# Patient Record
Sex: Male | Born: 1960
Health system: Southern US, Community
[De-identification: ages and names within clinical notes are randomized; demographics above are authoritative.]

## PROBLEM LIST (undated history)

## (undated) DIAGNOSIS — E785 Hyperlipidemia, unspecified: Secondary | ICD-10-CM

## (undated) DIAGNOSIS — H9190 Unspecified hearing loss, unspecified ear: Secondary | ICD-10-CM

## (undated) DIAGNOSIS — L659 Nonscarring hair loss, unspecified: Secondary | ICD-10-CM

## (undated) DIAGNOSIS — N400 Enlarged prostate without lower urinary tract symptoms: Secondary | ICD-10-CM

## (undated) DIAGNOSIS — H8109 Meniere's disease, unspecified ear: Secondary | ICD-10-CM

## (undated) DIAGNOSIS — F419 Anxiety disorder, unspecified: Secondary | ICD-10-CM

## (undated) HISTORY — DX: Nonscarring hair loss, unspecified: L65.9

## (undated) HISTORY — DX: Unspecified hearing loss, unspecified ear: H91.90

## (undated) HISTORY — PX: HIP SURGERY: SHX245

## (undated) HISTORY — DX: Anxiety disorder, unspecified: F41.9

## (undated) HISTORY — DX: Hyperlipidemia, unspecified: E78.5

## (undated) HISTORY — PX: KNEE ARTHROSCOPY: SUR90

## (undated) HISTORY — DX: Meniere's disease, unspecified ear: H81.09

## (undated) HISTORY — DX: Benign prostatic hyperplasia without lower urinary tract symptoms: N40.0

## (undated) HISTORY — PX: EYE SURGERY: SHX253

---

## 1995-08-25 HISTORY — PX: KNEE ARTHROSCOPY: SUR90

## 1997-08-24 HISTORY — PX: HIP SURGERY: SHX245

## 1997-11-17 ENCOUNTER — Other Ambulatory Visit: Admission: RE | Admit: 1997-11-17 | Discharge: 1997-11-17 | Payer: Self-pay | Admitting: Specialist

## 1997-12-24 ENCOUNTER — Ambulatory Visit (HOSPITAL_COMMUNITY): Admission: RE | Admit: 1997-12-24 | Discharge: 1997-12-24 | Payer: Self-pay | Admitting: Neurology

## 1998-01-07 ENCOUNTER — Encounter: Admission: RE | Admit: 1998-01-07 | Discharge: 1998-01-07 | Payer: Self-pay | Admitting: Internal Medicine

## 1998-06-26 ENCOUNTER — Encounter: Payer: Self-pay | Admitting: Internal Medicine

## 1998-06-26 ENCOUNTER — Ambulatory Visit (HOSPITAL_COMMUNITY): Admission: RE | Admit: 1998-06-26 | Discharge: 1998-06-26 | Payer: Self-pay | Admitting: Internal Medicine

## 1998-06-28 ENCOUNTER — Ambulatory Visit (HOSPITAL_COMMUNITY): Admission: RE | Admit: 1998-06-28 | Discharge: 1998-06-28 | Payer: Self-pay | Admitting: Internal Medicine

## 1998-06-28 ENCOUNTER — Encounter: Payer: Self-pay | Admitting: Internal Medicine

## 1998-07-10 ENCOUNTER — Other Ambulatory Visit: Admission: RE | Admit: 1998-07-10 | Discharge: 1998-07-10 | Payer: Self-pay | Admitting: Gastroenterology

## 1998-10-11 ENCOUNTER — Other Ambulatory Visit: Admission: RE | Admit: 1998-10-11 | Discharge: 1998-10-11 | Payer: Self-pay | Admitting: Specialist

## 1998-11-13 ENCOUNTER — Ambulatory Visit (HOSPITAL_COMMUNITY): Admission: RE | Admit: 1998-11-13 | Discharge: 1998-11-13 | Payer: Self-pay | Admitting: Orthopedic Surgery

## 1999-09-22 ENCOUNTER — Encounter: Admission: RE | Admit: 1999-09-22 | Discharge: 1999-09-22 | Payer: Self-pay | Admitting: Internal Medicine

## 1999-12-19 ENCOUNTER — Encounter: Payer: Self-pay | Admitting: Orthopedic Surgery

## 1999-12-24 ENCOUNTER — Ambulatory Visit (HOSPITAL_COMMUNITY): Admission: RE | Admit: 1999-12-24 | Discharge: 1999-12-24 | Payer: Self-pay | Admitting: Orthopedic Surgery

## 2006-09-07 ENCOUNTER — Ambulatory Visit: Payer: Self-pay | Admitting: Internal Medicine

## 2006-09-07 LAB — CONVERTED CEMR LAB
ALT: 20 units/L (ref 0–40)
AST: 28 units/L (ref 0–37)
Albumin: 4.1 g/dL (ref 3.5–5.2)
Alkaline Phosphatase: 36 units/L — ABNORMAL LOW (ref 39–117)
BUN: 16 mg/dL (ref 6–23)
Basophils Absolute: 0 10*3/uL (ref 0.0–0.1)
Basophils Relative: 0.4 % (ref 0.0–1.0)
CO2: 30 meq/L (ref 19–32)
Calcium: 9.9 mg/dL (ref 8.4–10.5)
Chloride: 101 meq/L (ref 96–112)
Cholesterol: 169 mg/dL (ref 0–200)
Creatinine, Ser: 0.9 mg/dL (ref 0.4–1.5)
Eosinophils Relative: 4.6 % (ref 0.0–5.0)
GFR calc Af Amer: 116 mL/min
GFR calc non Af Amer: 96 mL/min
Glucose, Bld: 94 mg/dL (ref 70–99)
HCT: 41.4 % (ref 39.0–52.0)
HDL: 54.3 mg/dL (ref >39.0)
Hemoglobin: 14.2 g/dL (ref 13.0–17.0)
LDL Cholesterol: 100 mg/dL — ABNORMAL HIGH (ref 0–99)
Lymphocytes Relative: 36.3 % (ref 12.0–46.0)
MCHC: 34.4 g/dL (ref 30.0–36.0)
MCV: 92.9 fL (ref 78.0–100.0)
Monocytes Absolute: 0.4 10*3/uL (ref 0.2–0.7)
Monocytes Relative: 8.1 % (ref 3.0–11.0)
Neutro Abs: 2.2 10*3/uL (ref 1.4–7.7)
Neutrophils Relative %: 50.6 % (ref 43.0–77.0)
Platelets: 196 10*3/uL (ref 150–400)
Potassium: 4.4 meq/L (ref 3.5–5.1)
RBC: 4.46 M/uL (ref 4.22–5.81)
RDW: 12.3 % (ref 11.5–14.6)
Sodium: 137 meq/L (ref 135–145)
TSH: 1.59 microintl units/mL (ref 0.35–5.50)
Total Bilirubin: 1.2 mg/dL (ref 0.3–1.2)
Total CHOL/HDL Ratio: 3.1
Total Protein: 7.5 g/dL (ref 6.0–8.3)
Triglycerides: 73 mg/dL (ref 0–149)
VLDL: 15 mg/dL (ref 0–40)
WBC: 4.4 10*3/uL — ABNORMAL LOW (ref 4.5–10.5)

## 2006-09-13 ENCOUNTER — Ambulatory Visit: Payer: Self-pay | Admitting: Internal Medicine

## 2007-09-01 ENCOUNTER — Telehealth: Payer: Self-pay | Admitting: Internal Medicine

## 2007-09-19 ENCOUNTER — Encounter: Payer: Self-pay | Admitting: Internal Medicine

## 2008-09-14 ENCOUNTER — Ambulatory Visit: Payer: Self-pay | Admitting: Internal Medicine

## 2008-10-02 LAB — CONVERTED CEMR LAB
ALT: 16 units/L (ref 0–53)
AST: 22 units/L (ref 0–37)
Albumin: 4.8 g/dL (ref 3.5–5.2)
Alkaline Phosphatase: 36 units/L — ABNORMAL LOW (ref 39–117)
BUN: 18 mg/dL (ref 6–23)
Basophils Absolute: 0 10*3/uL (ref 0.0–0.1)
Basophils Relative: 1 % (ref 0–1)
Bilirubin, Direct: 0.1 mg/dL (ref 0.0–0.3)
CO2: 25 meq/L (ref 19–32)
Calcium: 10 mg/dL (ref 8.4–10.5)
Chloride: 101 meq/L (ref 96–112)
Cholesterol: 172 mg/dL (ref 0–200)
Creatinine, Ser: 1.14 mg/dL (ref 0.40–1.50)
Eosinophils Absolute: 0.2 10*3/uL (ref 0.0–0.7)
Eosinophils Relative: 3 % (ref 0–5)
Glucose, Bld: 94 mg/dL (ref 70–99)
HCT: 42 % (ref 39.0–52.0)
HDL: 68 mg/dL (ref >39)
Hemoglobin: 14.6 g/dL (ref 13.0–17.0)
Hep B S Ab: POSITIVE — AB
Indirect Bilirubin: 0.6 mg/dL (ref 0.0–0.9)
LDL Cholesterol: 93 mg/dL (ref 0–99)
Lymphocytes Relative: 40 % (ref 12–46)
Lymphs Abs: 2.1 10*3/uL (ref 0.7–4.0)
MCHC: 34.8 g/dL (ref 30.0–36.0)
MCV: 89 fL (ref 78.0–100.0)
Monocytes Absolute: 0.3 10*3/uL (ref 0.1–1.0)
Monocytes Relative: 6 % (ref 3–12)
Neutro Abs: 2.7 10*3/uL (ref 1.7–7.7)
Neutrophils Relative %: 51 % (ref 43–77)
PSA: 0.41 ng/mL (ref 0.10–4.00)
Platelets: 218 10*3/uL (ref 150–400)
Potassium: 4.8 meq/L (ref 3.5–5.3)
RBC: 4.72 M/uL (ref 4.22–5.81)
RDW: 12.4 % (ref 11.5–15.5)
Rubella: 10.6 intl units/mL — ABNORMAL HIGH
Rubeola IgG: 0.94 — ABNORMAL HIGH
Sodium: 140 meq/L (ref 135–145)
TSH: 2.08 microintl units/mL (ref 0.350–4.50)
Testosterone: 481.15 ng/dL (ref 350–890)
Total Bilirubin: 0.7 mg/dL (ref 0.3–1.2)
Total CHOL/HDL Ratio: 2.5
Total Protein: 7.9 g/dL (ref 6.0–8.3)
Triglycerides: 56 mg/dL (ref <150)
VLDL: 11 mg/dL (ref 0–40)
WBC: 5.4 10*3/uL (ref 4.0–10.5)

## 2009-03-15 ENCOUNTER — Telehealth: Payer: Self-pay | Admitting: Gastroenterology

## 2011-01-09 NOTE — Op Note (Signed)
[place]  Patient:    [name], [name]                        MRN: [id number] Proc. Date: 12/24/99 Adm. Date:  [id number] Disc. 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  PROCEDURE:  Left hip arthroscopy with labral and chondral debridement.  SURGEON:  Trudee Grip, M.D.  ASSISTANT:  Grayland Jack, P.A.  ANESTHESIA:  General.  ESTIMATED BLOOD LOSS:  Minimal.  DRAINS:  None.  COMPLICATIONS:  None.  CONDITION:  Stable; to recovery.  BRIEF CLINICAL NOTE:  [name] is a 50 year old male who had severe pain and mechanical symptoms in both hips.  He underwent right hip arthroscopy, labral debridement nd chondroplasty two years ago with excellent result.  Symptoms have become progressive in his left hip.  He has documented evidence of labral tear and some degenerative changes on radiographic studies.  He presents now for arthroscopic  debridement.  PROCEDURE IN DETAIL:  After the successful administration of general anesthetic, the patient was placed in the right lateral decubitus position with left side up and a padded perineal post was placed between his legs.  His genitals are protected from the post.  The left foot is then placed into the traction boot and secured; it is well-padded before being placed in the boot.  Under fluoroscopic guidance, traction is applied to the hip to allow it to be distracted approximately 8 mm. At this point, the hip is prepped and draped in the usual sterile fashion.  The two long spinal needles are then passed, one anterior peritrochanteric and one posterior peritrochanteric, and shown to enter the joint under fluoroscopic guidance.  Fluid is injected through the posterior needle and it does flow outwards through the anterior,  confirming intra-articular placement.  The ______ wires are then passed through the needles and then the dilators, 5 then 7 mm, passed over the wires.  A 7-mm cannula is placed posteriorly and then the camera attached to it. Arthroscopic visualization of the joint is then initiated.  The placement for the anterior portal was felt to be ideal; thus, it was not changed.  Visualization n the joint showed that the entire posterior half of the acetabulum and femoral head as well as the posterior labrum appeared totally normal.  The fovea also appears normal with a normal ligamentum teres.  The femoral head appears normal throughout the entire visualization field; anteriorly, however, there is an area of chondral fissuring, just adjacent to an anterior labral tear.  At this point, the dilator is passed over the wire for the anterior portal and a 7-mm cannula is placed.  A probe is placed through this and the labral tear is interstitial and frayed.  The area of chondral fissuring is probed and is a very unstable chondral flap, which is delaminating from the underlying bone; this is debrided back to a bony base using a ring curette, 4.2 shaver and ArthroCare ablation wand.  The size of the defect s about 1 x 2 cm when fully debrided.  The labral tear is also debrided back to a  stable base and then probed and found to be stable.  Once  fully debrided, the edges of the cartilage on the acetabulum are probed and feel stable.  There is no further delamination.  The portal was then switched and the camera passed posterior and  working portal anterior.  A Steinmann pin, 7/16ths, is then passed through the anterior portal and then multiple perforations are made in the exposed anterior  acetabular bone to hopefully allow for fibrocartilage formation.  At this point, the equipment is removed and 15 cc of 0.5% Marcaine with epinephrine was injected through the inflow cannula.  The incisions  are then closed, a bulky sterile dressing applied and the patient awakened and transported to recovery in stable  condition.DD:  12/24/99 TD:  12/26/99 Job: [id number]

## 2013-05-18 ENCOUNTER — Encounter: Payer: Self-pay | Admitting: Gastroenterology

## 2013-05-24 ENCOUNTER — Encounter: Payer: Self-pay | Admitting: Gastroenterology

## 2013-07-26 ENCOUNTER — Ambulatory Visit (AMBULATORY_SURGERY_CENTER): Payer: Self-pay | Admitting: *Deleted

## 2013-07-26 VITALS — Ht 68.0 in | Wt 161.0 lb

## 2013-07-26 DIAGNOSIS — Z1211 Encounter for screening for malignant neoplasm of colon: Secondary | ICD-10-CM

## 2013-07-26 MED ORDER — MOVIPREP 100 G PO SOLR: ORAL | Status: DC

## 2013-07-26 NOTE — Progress Notes (Signed)
Patient denies any allergies to eggs or soy. 

## 2013-08-04 ENCOUNTER — Encounter: Payer: Self-pay | Admitting: Gastroenterology

## 2013-08-10 ENCOUNTER — Ambulatory Visit (AMBULATORY_SURGERY_CENTER): Payer: PRIVATE HEALTH INSURANCE | Admitting: Gastroenterology

## 2013-08-10 ENCOUNTER — Encounter: Payer: Self-pay | Admitting: Gastroenterology

## 2013-08-10 VITALS — BP 99/53 | HR 47 | Temp 98.1°F | Resp 18 | Ht 68.0 in | Wt 161.0 lb

## 2013-08-10 DIAGNOSIS — Z1211 Encounter for screening for malignant neoplasm of colon: Secondary | ICD-10-CM

## 2013-08-10 MED ORDER — SODIUM CHLORIDE 0.9 % IV SOLN: 500.0000 mL | INTRAVENOUS | Status: DC

## 2013-08-10 NOTE — Progress Notes (Signed)
No egg or soy allergy. ewm No problems with past sedation. ewm 

## 2013-08-10 NOTE — Progress Notes (Signed)
Report to pacu rn, vss, bbs=clear 

## 2013-08-10 NOTE — Op Note (Signed)
[facility] [address] [place], [address]   COLONOSCOPY PROCEDURE REPORT  PATIENT: Benjamin, Ellis  MR#: [id number] BIRTHDATE: 01/14/1961 , 52  yrs. old GENDER: Male ENDOSCOPIST: Meryl Dare, MD, [facility] REFERRED XB:JYNWGN Eloise Harman, M.D. PROCEDURE DATE:  08/10/2013 PROCEDURE:   Colonoscopy, screening First Screening Colonoscopy - Avg.  risk and is 50 yrs.  old or older Yes.  Prior Negative Screening - Now for repeat screening. N/A  History of Adenoma - Now for follow-up colonoscopy & has been > or = to 3 yrs.  N/A  Polyps Removed Today? No.  Recommend repeat exam, <10 yrs? No. ASA CLASS:   Class I INDICATIONS:average risk screening. MEDICATIONS: MAC sedation, administered by CRNA and propofol (Diprivan) 400mg  IV DESCRIPTION OF PROCEDURE:   After the risks benefits and alternatives of the procedure were thoroughly explained, informed consent was obtained.  A digital rectal exam revealed no abnormalities of the rectum.   The LB [id number]  endoscope was introduced through the anus and advanced to the cecum, which was identified by both the appendix and ileocecal valve. No adverse events experienced.   The quality of the prep was excellent, using MoviPrep  The instrument was then slowly withdrawn as the colon was fully examined.  COLON FINDINGS: A normal appearing cecum, ileocecal valve, and appendiceal orifice were identified.  The ascending, hepatic flexure, transverse, splenic flexure, descending, sigmoid colon and rectum appeared unremarkable.  No polyps or cancers were seen. Retroflexed views revealed no abnormalities. The time to cecum=6 minutes 43 seconds.  Withdrawal time=13 minutes 35 seconds.  The scope was withdrawn and the procedure completed.  COMPLICATIONS: There were no complications.  ENDOSCOPIC IMPRESSION: 1.  Normal colon  RECOMMENDATIONS: 1.  You should continue to follow colorectal cancer screening guidelines for "routine  risk" patients with a repeat colonoscopy in 10 years.  There is no need for routine screening FOBT (stool) testing for at least 5 years.  eSigned:  Meryl Dare, MD, [facility] 08/10/2013 8:39 AM

## 2013-08-10 NOTE — Patient Instructions (Signed)
YOU HAD AN ENDOSCOPIC PROCEDURE TODAY AT THE [facility]: Refer to the procedure report that was given to you for any specific questions about what was found during the examination.  If the procedure report does not answer your questions, please call your gastroenterologist to clarify.  If you requested that your care partner not be given the details of your procedure findings, then the procedure report has been included in a sealed envelope for you to review at your convenience later.  YOU SHOULD EXPECT: Some feelings of bloating in the abdomen. Passage of more gas than usual.  Walking can help get rid of the air that was put into your GI tract during the procedure and reduce the bloating. If you had a lower endoscopy (such as a colonoscopy or flexible sigmoidoscopy) you may notice spotting of blood in your stool or on the toilet paper. If you underwent a bowel prep for your procedure, then you may not have a normal bowel movement for a few days.  DIET: Your first meal following the procedure should be a light meal and then it is ok to progress to your normal diet.  A half-sandwich or bowl of soup is an example of a good first meal.  Heavy or fried foods are harder to digest and may make you feel nauseous or bloated.  Likewise meals heavy in dairy and vegetables can cause extra gas to form and this can also increase the bloating.  Drink plenty of fluids but you should avoid alcoholic beverages for 24 hours.  ACTIVITY: Your care partner should take you home directly after the procedure.  You should plan to take it easy, moving slowly for the rest of the day.  You can resume normal activity the day after the procedure however you should NOT DRIVE or use heavy machinery for 24 hours (because of the sedation medicines used during the test).    SYMPTOMS TO REPORT IMMEDIATELY: A gastroenterologist can be reached at any hour.  During normal business hours, 8:30 AM to 5:00 PM Monday through Friday,  call [phone].  After hours and on weekends, please call the GI answering service at [phone] who will take a message and have the physician on call contact you.   Following lower endoscopy (colonoscopy or flexible sigmoidoscopy):  Excessive amounts of blood in the stool  Significant tenderness or worsening of abdominal pains  Swelling of the abdomen that is new, acute  Fever of 100F or higher  FOLLOW UP: If any biopsies were taken you will be contacted by phone or by letter within the next 1-3 weeks.  Call your gastroenterologist if you have not heard about the biopsies in 3 weeks.  Our staff will call the home number listed on your records the next business day following your procedure to check on you and address any questions or concerns that you may have at that time regarding the information given to you following your procedure. This is a courtesy call and so if there is no answer at the home number and we have not heard from you through the emergency physician on call, we will assume that you have returned to your regular daily activities without incident.  SIGNATURES/CONFIDENTIALITY: You and/or your care partner have signed paperwork which will be entered into your electronic medical record.  These signatures attest to the fact that that the information above on your After Visit Summary has been reviewed and is understood.  Full responsibility of the confidentiality of this   discharge information lies with you and/or your care-partner.  Repeat colonoscopy in 10 years-2024 

## 2013-08-11 ENCOUNTER — Telehealth: Payer: Self-pay | Admitting: *Deleted

## 2013-08-11 NOTE — Telephone Encounter (Signed)
Patient did not answer the phone.  Name verified.  LMOM if he needed anything or had any questions or concerns to call us back at [phone].

## 2014-05-07 DIAGNOSIS — E785 Hyperlipidemia, unspecified: Secondary | ICD-10-CM | POA: Insufficient documentation

## 2015-05-23 DIAGNOSIS — E291 Testicular hypofunction: Secondary | ICD-10-CM | POA: Insufficient documentation

## 2015-08-29 MED FILL — SERTRALINE HCL 50 MG TABLET: 50 | 30 days supply | Qty: 30 | Fill #2

## 2015-08-29 MED FILL — ATORVASTATIN 20 MG TABLET: 20 | 30 days supply | Qty: 30 | Fill #3

## 2015-09-23 MED FILL — ATORVASTATIN 20 MG TABLET: 20 | 30 days supply | Qty: 30 | Fill #4

## 2015-09-23 MED FILL — SERTRALINE HCL 50 MG TABLET: 50 | 30 days supply | Qty: 30 | Fill #3

## 2015-10-21 MED FILL — ATORVASTATIN 20 MG TABLET: 20 | 30 days supply | Qty: 30 | Fill #5

## 2015-10-21 MED FILL — SERTRALINE HCL 50 MG TABLET: 50 | 30 days supply | Qty: 30 | Fill #4

## 2015-11-21 MED FILL — ATORVASTATIN 20 MG TABLET: 20 | 30 days supply | Qty: 30 | Fill #6

## 2015-11-21 MED FILL — SERTRALINE HCL 50 MG TABLET: 50 | 30 days supply | Qty: 30 | Fill #5

## 2015-11-21 MED FILL — FINASTERIDE 1 MG TABLET: 1 | 90 days supply | Qty: 90 | Fill #2

## 2015-12-30 MED FILL — SERTRALINE HCL 50 MG TABLET: 50 | 30 days supply | Qty: 30 | Fill #6

## 2015-12-30 MED FILL — ATORVASTATIN 20 MG TABLET: 20 | 30 days supply | Qty: 30 | Fill #7

## 2016-02-06 MED FILL — SERTRALINE HCL 50 MG TABLET: 50 | 30 days supply | Qty: 30 | Fill #7

## 2016-02-06 MED FILL — ATORVASTATIN 20 MG TABLET: 20 | 30 days supply | Qty: 30 | Fill #8

## 2016-03-13 MED FILL — ATORVASTATIN 20 MG TABLET: 20 | 30 days supply | Qty: 30 | Fill #9

## 2016-03-13 MED FILL — SERTRALINE HCL 50 MG TABLET: 50 | 30 days supply | Qty: 30 | Fill #8

## 2016-03-13 MED FILL — FINASTERIDE 1 MG TABLET: 1 | 90 days supply | Qty: 90 | Fill #3

## 2016-04-17 MED FILL — ATORVASTATIN 20 MG TABLET: 20 | 30 days supply | Qty: 30 | Fill #10

## 2016-04-17 MED FILL — SERTRALINE HCL 50 MG TABLET: 50 | 30 days supply | Qty: 30 | Fill #9

## 2016-06-18 MED FILL — FINASTERIDE 1 MG TABLET: 1 | 90 days supply | Qty: 90 | Fill #0

## 2016-09-21 MED FILL — FINASTERIDE 1 MG TABLET: 1 | 90 days supply | Qty: 90 | Fill #1

## 2016-12-31 MED FILL — FINASTERIDE 1 MG TABLET: 1 | 90 days supply | Qty: 90 | Fill #2

## 2017-05-03 MED FILL — FINASTERIDE 1 MG TABLET: 1 | 60 days supply | Qty: 60 | Fill #3

## 2017-07-12 MED FILL — FINASTERIDE 1 MG TABLET: 1 | 90 days supply | Qty: 90 | Fill #0

## 2017-10-15 MED FILL — FINASTERIDE 1 MG TABLET: 1 | 90 days supply | Qty: 90 | Fill #1 | Status: TO

## 2017-12-22 DIAGNOSIS — C4492 Squamous cell carcinoma of skin, unspecified: Secondary | ICD-10-CM

## 2017-12-22 HISTORY — DX: Squamous cell carcinoma of skin, unspecified: C44.92

## 2018-06-22 DIAGNOSIS — F419 Anxiety disorder, unspecified: Secondary | ICD-10-CM | POA: Insufficient documentation

## 2018-06-22 DIAGNOSIS — L659 Nonscarring hair loss, unspecified: Secondary | ICD-10-CM | POA: Insufficient documentation

## 2019-02-23 ENCOUNTER — Telehealth: Payer: Self-pay | Admitting: *Deleted

## 2019-02-23 ENCOUNTER — Other Ambulatory Visit: Payer: PRIVATE HEALTH INSURANCE

## 2019-02-23 DIAGNOSIS — Z20822 Contact with and (suspected) exposure to covid-19: Secondary | ICD-10-CM

## 2019-02-23 NOTE — Telephone Encounter (Signed)
Pt's wife JoEllen called stating that she tested positive for COVID; she would like for her family to be tested; she accepted appointment on behalf of her husband at [facility] site 02/23/2019 at 1215; she was given address, location, and instructions that all occupants of the vehicle should wear masks; she verbalized understanding; orders placed per protocol.

## 2019-02-27 ENCOUNTER — Telehealth: Payer: Self-pay | Admitting: *Deleted

## 2019-02-27 NOTE — Telephone Encounter (Signed)
Pt's wife [name] calling to inquire about COVID-19 testing results. Pt's wife advised that results were still pending at this time and not available. Pt's wife advised that results could take between 5-7 days or longer to return. Understanding verbalized.

## 2019-02-28 NOTE — Telephone Encounter (Signed)
Pt's wife Joellen calling about COVID-19 results. Notified pt's wife that results were not available at this time.

## 2019-03-01 LAB — NOVEL CORONAVIRUS, NAA: SARS-CoV-2, NAA: DETECTED — AB

## 2019-03-06 ENCOUNTER — Telehealth: Payer: Self-pay | Admitting: Internal Medicine

## 2019-03-06 DIAGNOSIS — Z20822 Contact with and (suspected) exposure to covid-19: Secondary | ICD-10-CM

## 2019-03-06 NOTE — Telephone Encounter (Signed)
Pt scheduled for testing 03/07/2019 and 03/08/2019. Pt tested positive last week and requires 2 negative results, 24 hours apart,  by workplace.  Scheduled and orders placed. Pts CB# [phone]

## 2019-03-07 ENCOUNTER — Other Ambulatory Visit: Payer: PRIVATE HEALTH INSURANCE

## 2019-03-07 ENCOUNTER — Telehealth: Payer: Self-pay | Admitting: Internal Medicine

## 2019-03-07 DIAGNOSIS — Z20822 Contact with and (suspected) exposure to covid-19: Secondary | ICD-10-CM

## 2019-03-07 NOTE — Telephone Encounter (Signed)
Order placed for second test 24hrs after first which was on 7/ 14/2020. Pt is [employer], workplace requires 2 neg results.

## 2019-03-08 ENCOUNTER — Other Ambulatory Visit: Payer: PRIVATE HEALTH INSURANCE

## 2019-03-08 DIAGNOSIS — Z20822 Contact with and (suspected) exposure to covid-19: Secondary | ICD-10-CM

## 2019-03-11 LAB — NOVEL CORONAVIRUS, NAA: SARS-CoV-2, NAA: NOT DETECTED

## 2019-03-12 LAB — NOVEL CORONAVIRUS, NAA: SARS-CoV-2, NAA: NOT DETECTED

## 2019-03-23 ENCOUNTER — Telehealth: Payer: Self-pay | Admitting: Internal Medicine

## 2019-03-23 NOTE — Telephone Encounter (Signed)
New message  Anderson Malta at [facility] wants see if the patient can be seen in person on Monday the patient is having chest pain issues. Please call Anderson Malta.

## 2019-03-23 NOTE — Telephone Encounter (Signed)
S/w Anderson Malta she states that pt "saw her NP today" at Dr Buel Ream office ans she states that she thinks that pt needs to be seen in person d/t worsening DOE and chest pain. Informed her that Dr Margaretann Loveless is not here in the office and will need to see pt Virtually. There are no other appts available Friday or Monday we do have and appt available Tuesday but suggested that pt keep virtual appt Monday with Dr Margaretann Loveless. She will direct if pt needs to come in or what needs to be done next. She states that she will update her office and will CB if anything else is needed.

## 2019-03-27 ENCOUNTER — Telehealth (INDEPENDENT_AMBULATORY_CARE_PROVIDER_SITE_OTHER): Payer: PRIVATE HEALTH INSURANCE | Admitting: Internal Medicine

## 2019-03-27 ENCOUNTER — Encounter: Payer: Self-pay | Admitting: Internal Medicine

## 2019-03-27 VITALS — Ht 68.0 in | Wt 147.0 lb

## 2019-03-27 DIAGNOSIS — J988 Other specified respiratory disorders: Secondary | ICD-10-CM

## 2019-03-27 DIAGNOSIS — R0781 Pleurodynia: Secondary | ICD-10-CM

## 2019-03-27 DIAGNOSIS — U071 COVID-19: Secondary | ICD-10-CM | POA: Diagnosis not present

## 2019-03-27 DIAGNOSIS — R0789 Other chest pain: Secondary | ICD-10-CM | POA: Diagnosis not present

## 2019-03-27 NOTE — Patient Instructions (Addendum)
Medication Instructions:  Dr Margaretann Loveless recommends that you continue on your current medications as directed. Please refer to the Current Medication list given to you today.  If you need a refill on your cardiac medications before your next appointment, please call your pharmacy.   Lab work: Your physician recommends that you return for lab work at your earliest convenience.  If you have labs (blood work) drawn today and your tests are completely normal, you will receive your results only by: Marland Kitchen MyChart Message (if you have MyChart) OR . A paper copy in the mail If you have any lab test that is abnormal or we need to change your treatment, we will call you to review the results.  Testing/Procedures: 1. Cardiac MRI - Your physician has requested that you have a cardiac MRI. Cardiac MRI uses a computer to create images of your heart as its beating, producing both still and moving pictures of your heart and major blood vessels. For further information please visit [email or web address] You will be provided with specific instructions when the test is scheduled.  Follow-Up: Dr Margaretann Loveless recommends that you schedule a follow-up appointment after testing is completed with Dr Harrell Gave.

## 2019-03-27 NOTE — Progress Notes (Signed)
Virtual Visit via Video Note   This visit type was conducted due to national recommendations for restrictions regarding the COVID-19 Pandemic (e.g. social distancing) in an effort to limit this patient's exposure and mitigate transmission in our community.  Due to his co-morbid illnesses, this patient is at least at moderate risk for complications without adequate follow up.  This format is felt to be most appropriate for this patient at this time.  All issues noted in this document were discussed and addressed.  A limited physical exam was performed with this format.  Please refer to the patient's chart for his consent to telehealth for [facility].   Date:  03/27/2019   ID:  [name], DOB [birth year], MRN [id number]  Patient Location: Home Provider Location: Home  PCP:  Leanna Battles, MD  Cardiologist:  No primary care provider on file.   Electrophysiologist:  None   Evaluation Performed:  New Patient Evaluation  Chief Complaint:  Chest pain  History of Present Illness:    [name] is a 58 y.o. male with no significant past medical history who presents with constant 2/10 chest discomfort. He recently had COVID-19 as did his family, and primarily had GI symptoms. He is a very active person, exercising at a high level. He has noticed constant chest discomfort which, with activity, will briefly escalate to 7-8/10 and then after a period of activity or bending forward, will return to a 2/10. He feels things have slightly improved recently, but is still concerned about this symptom. He has essentially returned to his normal activities without significant exercise intolerance.   No fhx of early MI or SCD.   The patient denies dyspnea at rest or with exertion, palpitations, PND, orthopnea, or leg swelling. Denies syncope or presyncope. Denies dizziness or lightheadedness.   The patient does not have symptoms concerning for COVID-19 infection (fever, chills, cough, or new  shortness of breath).    No past medical history on file. Past Surgical History:  Procedure Laterality Date  . HIP SURGERY    . KNEE ARTHROSCOPY       Current Meds  Medication Sig  . lovastatin (MEVACOR) 20 MG tablet Take 20 mg by mouth at bedtime.  . sertraline (ZOLOFT) 50 MG tablet Take 1 tablet by mouth daily.     Allergies:   Patient has no known allergies.   Social History   Tobacco Use  . Smoking status: Never Smoker  . Smokeless tobacco: Never Used  Substance Use Topics  . Alcohol use: Yes    Alcohol/week: 2.0 standard drinks    Types: 2 Glasses of wine per week  . Drug use: No     Family Hx: The patient's family history includes Heart attack in his father; Parkinson's disease in his father. There is no history of Colon cancer.  ROS:   Please see the history of present illness.     All other systems reviewed and are negative.   Prior CV studies:   The following studies were reviewed today:    Labs/Other Tests and Data Reviewed:    EKG:  No ECG reviewed.  Recent Labs: No results found for requested labs within last 8760 hours.   Recent Lipid Panel Lab Results  Component Value Date/Time   CHOL 172 09/14/2008 11:22 PM   TRIG 56 09/14/2008 11:22 PM   HDL 68 09/14/2008 11:22 PM   CHOLHDL 2.5 Ratio 09/14/2008 11:22 PM   LDLCALC 93 09/14/2008 11:22 PM  Wt Readings from Last 3 Encounters:  03/27/19 147 lb (66.7 kg)  08/10/13 161 lb (73 kg)  07/26/13 161 lb (73 kg)     Objective:    Vital Signs:  Ht _0  (1.727 m)   Wt 147 lb (66.7 kg)   BMI 22.35 kg/m    VITAL SIGNS:  reviewed GEN:  no acute distress EYES:  sclerae anicteric, EOMI - Extraocular Movements Intact RESPIRATORY:  normal respiratory effort, symmetric expansion CARDIOVASCULAR:  no peripheral edema SKIN:  no rash, lesions or ulcers. MUSCULOSKELETAL:  no obvious deformities. NEURO:  alert and oriented x 3, no obvious focal deficit PSYCH:  normal affect    ASSESSMENT &  PLAN:    1. Atypical chest pain   2. Pleuritic chest pain   3. COVID-19    Symptoms could be consistent with peri/myocarditis with recent covid infection. Less likely ischemia however will pursue stress test if pain persists. Recent COVID infection in July.   For evaluation for peri/myocarditis we will perform an echocardiogram as well as an MRI, as MRI will help [place] understand if there is inflammation. Will obtain ESR and CRP as well.   I will have Dr. Brien Few follow up with my partner with Dr. Harrell Gave after testing complete.  COVID-19 Education: The signs and symptoms of COVID-19 were discussed with the patient and how to seek care for testing (follow up with PCP or arrange E-visit).  The importance of social distancing was discussed today.  Time:   Today, I have spent 20 minutes with the patient with telehealth technology discussing the above problems, as well as an additional 25 minutes reviewing the medical record and outside referral notes.     Medication Adjustments/Labs and Tests Ordered: Current medicines are reviewed at length with the patient today.  Concerns regarding medicines are outlined above.   Tests Ordered: No orders of the defined types were placed in this encounter.   Medication Changes: No orders of the defined types were placed in this encounter.   Follow Up:   After results  Signed, Elouise Munroe, MD  03/27/2019 10:48 AM    [name]

## 2019-03-29 ENCOUNTER — Encounter: Payer: Self-pay | Admitting: Internal Medicine

## 2019-03-29 ENCOUNTER — Other Ambulatory Visit: Payer: Self-pay

## 2019-03-29 ENCOUNTER — Ambulatory Visit (HOSPITAL_COMMUNITY): Payer: PRIVATE HEALTH INSURANCE | Attending: Internal Medicine

## 2019-03-29 DIAGNOSIS — R0789 Other chest pain: Secondary | ICD-10-CM | POA: Insufficient documentation

## 2019-03-30 LAB — C-REACTIVE PROTEIN: CRP: 2 mg/L (ref 0–10)

## 2019-03-30 LAB — SEDIMENTATION RATE: Sed Rate: 19 mm/hr (ref 0–30)

## 2019-03-31 ENCOUNTER — Telehealth: Payer: Self-pay | Admitting: Internal Medicine

## 2019-03-31 NOTE — Telephone Encounter (Signed)
New Message   Patient's wife calling in to get the patient's MRI scheduled. Please give patient's wife a call back.

## 2019-03-31 NOTE — Telephone Encounter (Signed)
LVM for patient spouse regarding cardiac MRI.

## 2019-04-03 ENCOUNTER — Other Ambulatory Visit (HOSPITAL_COMMUNITY): Payer: PRIVATE HEALTH INSURANCE

## 2019-04-03 DIAGNOSIS — R0789 Other chest pain: Secondary | ICD-10-CM

## 2019-04-07 ENCOUNTER — Other Ambulatory Visit: Payer: Self-pay | Admitting: *Deleted

## 2019-04-07 DIAGNOSIS — R0781 Pleurodynia: Secondary | ICD-10-CM

## 2019-04-08 LAB — TROPONIN I: Troponin I: <0.01 ng/mL (ref 0.00–0.04)

## 2019-04-11 ENCOUNTER — Telehealth (HOSPITAL_COMMUNITY): Payer: Self-pay | Admitting: Emergency Medicine

## 2019-04-11 NOTE — Telephone Encounter (Signed)
mychart message sent

## 2019-04-12 ENCOUNTER — Ambulatory Visit (HOSPITAL_COMMUNITY)
Admission: RE | Admit: 2019-04-12 | Discharge: 2019-04-12 | Disposition: A | Discharge status: Home / Self Care | Payer: PRIVATE HEALTH INSURANCE | Source: Ambulatory Visit | Attending: Internal Medicine | Admitting: Internal Medicine

## 2019-04-12 ENCOUNTER — Other Ambulatory Visit: Payer: Self-pay

## 2019-04-12 DIAGNOSIS — U071 COVID-19: Secondary | ICD-10-CM | POA: Diagnosis present

## 2019-04-12 DIAGNOSIS — J988 Other specified respiratory disorders: Secondary | ICD-10-CM | POA: Insufficient documentation

## 2019-04-12 DIAGNOSIS — R0781 Pleurodynia: Secondary | ICD-10-CM | POA: Diagnosis not present

## 2019-04-12 DIAGNOSIS — R079 Chest pain, unspecified: Secondary | ICD-10-CM | POA: Diagnosis not present

## 2019-04-12 MED ORDER — GADOBUTROL 1 MMOL/ML IV SOLN
8.0000 mL | Freq: Once | INTRAVENOUS | Status: AC | PRN
  Administered 2019-04-12: 8 mL via INTRAVENOUS

## 2019-04-21 ENCOUNTER — Other Ambulatory Visit (HOSPITAL_COMMUNITY): Payer: PRIVATE HEALTH INSURANCE

## 2019-04-26 ENCOUNTER — Ambulatory Visit: Payer: PRIVATE HEALTH INSURANCE | Admitting: Cardiology

## 2019-09-14 ENCOUNTER — Telehealth: Payer: Self-pay | Admitting: *Deleted

## 2019-09-14 NOTE — Telephone Encounter (Signed)
A message was left, re: his follow up visit. 

## 2019-11-23 DIAGNOSIS — N453 Epididymo-orchitis: Secondary | ICD-10-CM

## 2019-11-23 HISTORY — DX: Epididymo-orchitis: N45.3

## 2019-12-13 ENCOUNTER — Other Ambulatory Visit: Payer: Self-pay

## 2019-12-13 ENCOUNTER — Emergency Department (HOSPITAL_COMMUNITY): Payer: PRIVATE HEALTH INSURANCE

## 2019-12-13 ENCOUNTER — Encounter (HOSPITAL_BASED_OUTPATIENT_CLINIC_OR_DEPARTMENT_OTHER): Payer: Self-pay | Admitting: Emergency Medicine

## 2019-12-13 ENCOUNTER — Emergency Department (HOSPITAL_BASED_OUTPATIENT_CLINIC_OR_DEPARTMENT_OTHER)
Admission: EM | Admit: 2019-12-13 | Discharge: 2019-12-13 | Disposition: A | Discharge status: Home / Self Care | Payer: PRIVATE HEALTH INSURANCE | Attending: Emergency Medicine | Admitting: Emergency Medicine

## 2019-12-13 DIAGNOSIS — N50811 Right testicular pain: Secondary | ICD-10-CM | POA: Diagnosis present

## 2019-12-13 DIAGNOSIS — N451 Epididymitis: Secondary | ICD-10-CM | POA: Diagnosis not present

## 2019-12-13 DIAGNOSIS — Z20822 Contact with and (suspected) exposure to covid-19: Secondary | ICD-10-CM | POA: Diagnosis not present

## 2019-12-13 DIAGNOSIS — R300 Dysuria: Secondary | ICD-10-CM | POA: Insufficient documentation

## 2019-12-13 DIAGNOSIS — Z79899 Other long term (current) drug therapy: Secondary | ICD-10-CM | POA: Diagnosis not present

## 2019-12-13 DIAGNOSIS — R109 Unspecified abdominal pain: Secondary | ICD-10-CM | POA: Insufficient documentation

## 2019-12-13 LAB — COMPREHENSIVE METABOLIC PANEL
ALT: 21 U/L (ref 0–44)
AST: 31 U/L (ref 15–41)
Albumin: 4.2 g/dL (ref 3.5–5.0)
Alkaline Phosphatase: 35 U/L — ABNORMAL LOW (ref 38–126)
Anion gap: 10 (ref 5–15)
BUN: 22 mg/dL — ABNORMAL HIGH (ref 6–20)
CO2: 22 mmol/L (ref 22–32)
Calcium: 9.4 mg/dL (ref 8.9–10.3)
Chloride: 100 mmol/L (ref 98–111)
Creatinine, Ser: 1.09 mg/dL (ref 0.61–1.24)
GFR calc Af Amer: >60 mL/min (ref >60)
GFR calc non Af Amer: >60 mL/min (ref >60)
Glucose, Bld: 172 mg/dL — ABNORMAL HIGH (ref 70–99)
Potassium: 4 mmol/L (ref 3.5–5.1)
Sodium: 132 mmol/L — ABNORMAL LOW (ref 135–145)
Total Bilirubin: 0.7 mg/dL (ref 0.3–1.2)
Total Protein: 7.4 g/dL (ref 6.5–8.1)

## 2019-12-13 LAB — URINALYSIS, MICROSCOPIC (REFLEX): WBC, UA: >50 WBC/hpf (ref 0–5)

## 2019-12-13 LAB — CBC WITH DIFFERENTIAL/PLATELET
Abs Immature Granulocytes: 0.04 10*3/uL (ref 0.00–0.07)
Basophils Absolute: 0 10*3/uL (ref 0.0–0.1)
Basophils Relative: 0 %
Eosinophils Absolute: 0 10*3/uL (ref 0.0–0.5)
Eosinophils Relative: 0 %
HCT: 41.1 % (ref 39.0–52.0)
Hemoglobin: 14.2 g/dL (ref 13.0–17.0)
Immature Granulocytes: 0 %
Lymphocytes Relative: 5 %
Lymphs Abs: 0.7 10*3/uL (ref 0.7–4.0)
MCH: 31.8 pg (ref 26.0–34.0)
MCHC: 34.5 g/dL (ref 30.0–36.0)
MCV: 91.9 fL (ref 80.0–100.0)
Monocytes Absolute: 0.9 10*3/uL (ref 0.1–1.0)
Monocytes Relative: 6 %
Neutro Abs: 12.4 10*3/uL — ABNORMAL HIGH (ref 1.7–7.7)
Neutrophils Relative %: 89 %
Platelets: 182 10*3/uL (ref 150–400)
RBC: 4.47 MIL/uL (ref 4.22–5.81)
RDW: 12.9 % (ref 11.5–15.5)
WBC: 14 10*3/uL — ABNORMAL HIGH (ref 4.0–10.5)
nRBC: 0 % (ref 0.0–0.2)

## 2019-12-13 LAB — URINALYSIS, ROUTINE W REFLEX MICROSCOPIC
Bilirubin Urine: NEGATIVE
Glucose, UA: NEGATIVE mg/dL
Ketones, ur: 15 mg/dL — AB
Nitrite: POSITIVE — AB
Protein, ur: NEGATIVE mg/dL
Specific Gravity, Urine: 1.02 (ref 1.005–1.030)
pH: 7 (ref 5.0–8.0)

## 2019-12-13 LAB — LIPASE, BLOOD: Lipase: 27 U/L (ref 11–51)

## 2019-12-13 MED ORDER — MORPHINE SULFATE (PF) 4 MG/ML IV SOLN
4.0000 mg | Freq: Once | INTRAVENOUS | Status: AC
  Administered 2019-12-13: 07:00:00 4 mg via INTRAVENOUS
  Filled 2019-12-13: qty 1

## 2019-12-13 MED ORDER — ONDANSETRON HCL 4 MG/2ML IJ SOLN
4.0000 mg | Freq: Once | INTRAMUSCULAR | Status: AC
  Administered 2019-12-13: 06:00:00 4 mg via INTRAVENOUS
  Filled 2019-12-13: qty 2

## 2019-12-13 MED ORDER — MORPHINE SULFATE (PF) 4 MG/ML IV SOLN
4.0000 mg | Freq: Once | INTRAVENOUS | Status: AC
  Administered 2019-12-13: 4 mg via INTRAVENOUS
  Filled 2019-12-13: qty 1

## 2019-12-13 MED ORDER — SODIUM CHLORIDE 0.9 % IV BOLUS
1000.0000 mL | Freq: Once | INTRAVENOUS | Status: AC
  Administered 2019-12-13: 1000 mL via INTRAVENOUS

## 2019-12-13 MED ORDER — LEVOFLOXACIN 500 MG PO TABS: 500.0000 mg | ORAL_TABLET | Freq: Every day | ORAL | 0 refills | Status: DC

## 2019-12-13 MED ORDER — PHENAZOPYRIDINE HCL 200 MG PO TABS: 200.0000 mg | ORAL_TABLET | Freq: Three times a day (TID) | ORAL | 0 refills | Status: DC

## 2019-12-13 MED ORDER — ONDANSETRON 4 MG PO TBDP: 4.0000 mg | ORAL_TABLET | Freq: Three times a day (TID) | ORAL | 0 refills | Status: DC | PRN

## 2019-12-13 MED ORDER — SODIUM CHLORIDE 0.9 % IV SOLN
1.0000 g | Freq: Once | INTRAVENOUS | Status: AC
  Administered 2019-12-13: 1 g via INTRAVENOUS
  Filled 2019-12-13: qty 10

## 2019-12-13 NOTE — ED Provider Notes (Addendum)
TIME SEEN: 6:20 AM  CHIEF COMPLAINT: Right flank pain, right testicular pain  HPI: Patient is a 59 year old male who presents to the emergency department with right testicular pain, right flank pain that started and worsened overnight.  Has had subjective fevers, rigors, nausea when the pain is more severe.  No vomiting or diarrhea.  Did have urinary tract infection about a month ago and finished antibiotics.  Was followed by urology at that time.  Was started on Flomax for BPH and was per given a prescription for Pyridium but never started it.  States he still has had some dysuria but no gross hematuria.  States he did begin riding a new bicycle recently and he is wondering if this has contributed to his right testicular pain.  No other trauma.  He denies any penile discharge, redness, warmth or noted swelling to the testicle.  He has not appreciated any mass but states whenever this testicle is manipulated he has significantly worsening pain.  He denies any history of abdominal surgery.  Denies history of previous pyelonephritis or kidney stone.  Presents to the emergency department with his wife at bedside.  Patient works as a Garment/textile technologist at [employer].  He denies any chest pain, shortness of breath, cough.  Reports he did have COVID-19 and has also had 2 Covid vaccinations.  ROS: See HPI Constitutional: Subjective fever  Eyes: no drainage  ENT: no runny nose   Cardiovascular:  no chest pain  Resp: no SOB  GI: + Nausea, no vomiting GU: Intermittent dysuria Integumentary: no rash  Allergy: no hives  Musculoskeletal: no leg swelling  Neurological: no slurred speech ROS otherwise negative  PAST MEDICAL HISTORY/PAST SURGICAL HISTORY:  History reviewed. No pertinent past medical history.  MEDICATIONS:  Prior to Admission medications   Medication Sig Start Date End Date Taking? Authorizing Provider  lovastatin (MEVACOR) 20 MG tablet Take 20 mg by mouth at bedtime.    [provider]  sertraline (ZOLOFT) 50 MG tablet Take 1 tablet by mouth daily. 03/23/19   [provider]    ALLERGIES:  No Known Allergies  SOCIAL HISTORY:  Social History   Tobacco Use  . Smoking status: Never Smoker  . Smokeless tobacco: Never Used  Substance Use Topics  . Alcohol use: Yes    Alcohol/week: 2.0 standard drinks    Types: 2 Glasses of wine per week    FAMILY HISTORY: Family History  Problem Relation Age of Onset  . Parkinson's disease Father   . Heart attack Father   . Colon cancer Neg Hx     EXAM: BP 126/66   Pulse 70   Temp 97.9 F (36.6 C) (Oral)   Resp 20   Ht 5\' 8"  (1.727 m)   Wt 68 kg   SpO2 100%   BMI 22.81 kg/m  CONSTITUTIONAL: Alert and oriented and responds appropriately to questions.  Afebrile and nontoxic-appearing but does appear quite uncomfortable. HEAD: Normocephalic EYES: Conjunctivae clear, pupils appear equal, EOM appear intact ENT: normal nose; moist mucous membranes NECK: Supple, normal ROM CARD: RRR; S1 and S2 appreciated; no murmurs, no clicks, no rubs, no gallops RESP: Normal chest excursion without splinting or tachypnea; breath sounds clear and equal bilaterally; no wheezes, no rhonchi, no rales, no hypoxia or respiratory distress, speaking full sentences ABD/GI: Normal bowel sounds; non-distended; soft, non-tender, no rebound, no guarding, no peritoneal signs, no hepatosplenomegaly BACK:  The back appears normal; no significant CVA tenderness GU:  Normal external genitalia, circumcised male,  normal penile shaft, no blood or discharge at the urethral meatus.  Patient has an extremely tender, enlarged and firm right testicle compared to the left.  I do not appreciate an hernia.  He has no sign of gangrene or crepitus on exam.  Exam is limited due to significant pain.  I am unable to assess cremasteric reflex.  Unable to assess for a mass.  No scrotal redness, warmth, induration or fluctuance.  Chaperone present for  exam. EXT: Normal ROM in all joints; no deformity noted, no edema; no cyanosis SKIN: Normal color for age and race; warm; no rash on exposed skin NEURO: Moves all extremities equally PSYCH: The patient's mood and manner are appropriate.   MEDICAL DECISION MAKING: Patient here with worsening right testicular pain.  Differential includes testicular torsion, epididymitis, UTI.  Discussed with patient that kidney stone, pyelonephritis also in the differential given pain radiates up the abdomen and into the flank but this seems less likely given he has such significant pain with palpation of the testicle and the testicle appears enlarged, firm compared to the left side.  I do not appreciate an obvious hernia.  Discussed with patient and wife that I feel he needs an ultrasound of the scrotum with Doppler to rule out torsion and to evaluate for possible epididymitis, mass.  Unfortunately at this time we are unable to obtain at [facility] and our ultrasonographer will not be here until 8 AM.  Discussed options of transfer to [facility] by private vehicle versus ambulance and they would prefer transfer by private vehicle.  Will obtain labs, urine and urine culture.  Will give IV pain and nausea medicine prior to transport to [facility].  Will discuss with ED physician at [facility] as well.  ED PROGRESS: 6:18 AM  Spoke with Dr. Wilkie Aye at [facility] who agrees to accept patient in transfer.  Patient and wife would like to go by private vehicle.  Will secure peripheral IV prior to transport.  He will stay n.p.o.  Ultrasound with Doppler has been ordered.  Discussed with patient and wife if this is normal/unrevealing, will likely proceed with CT renal study.  I reviewed all nursing notes and pertinent previous records as available.  I have reviewed and interpreted any EKGs, lab and urine results, imaging (as available).    CRITICAL CARE Performed by: Rochele Raring   Total critical care  time: 35 minutes  Critical care time was exclusive of separately billable procedures and treating other patients.  Critical care was necessary to treat or prevent imminent or life-threatening deterioration.  Critical care was time spent personally by me on the following activities: development of treatment plan with patient and/or surrogate as well as nursing, discussions with consultants, evaluation of patient's response to treatment, examination of patient, obtaining history from patient or surrogate, ordering and performing treatments and interventions, ordering and review of laboratory studies, ordering and review of radiographic studies, pulse oximetry and re-evaluation of patient's condition.   [name] was evaluated in Emergency Department on 12/13/2019 for the symptoms described in the history of present illness. He was evaluated in the context of the global COVID-19 pandemic, which necessitated consideration that the patient might be at risk for infection with the SARS-CoV-2 virus that causes COVID-19. Institutional protocols and algorithms that pertain to the evaluation of patients at risk for COVID-19 are in a state of rapid change based on information released by regulatory bodies including the CDC and federal and  state organizations. These policies and algorithms were followed during the patient's care in the ED.      Lynnley Doddridge, Layla Maw, DO 12/13/19 0627   6:28 AM patient's CBC and urinalysis have been reviewed/interpreted.  Pt's urine does appear grossly infected and his labs show leukocytosis with left shift. CMP and lipase pending at time of transfer. Urine culture is pending.  I lean more toward UTI, pyelonephritis, epididymitis at this time.  I have ordered IV Rocephin but given he does not appear septic, toxic I feel this antibiotic can be started when he arrives at [facility].  I do not want to delay his care any further and I feel he needs to get there as soon as  possible for an ultrasound.   Marquel Pottenger, Layla Maw, DO 12/13/19 0630

## 2019-12-13 NOTE — ED Notes (Signed)
Patient transported to CT 

## 2019-12-13 NOTE — ED Provider Notes (Signed)
[facility]-EMERGENCY DEPT Provider Note   CSN: [id number] Arrival date & time: 12/13/19  0555     History Chief Complaint  Patient presents with  . Abdominal Pain    [name] is a 59 y.o. male.  HPI  Patient is a 59 year old male transferred here from [facility] for further evaluation.  Patient presents to emergency department complaining of right testicle pain, right flank pain that started last night.  He states that onset was rapid and associated with fevers.  He was evaluated at [facility] prior to arrival in emergency department at [facility].  There is concern for torsion versus epididymitis versus other acute abnormality.  There are some consideration given to infected kidney stone as patient had recent urinary tract infection for which we have treated with ciprofloxacin.  Patient states the pain is severe, sharp, crampy, achy, worse with movement or palpation of the right testicle.     History reviewed. No pertinent past medical history.  There are no problems to display for this patient.   Past Surgical History:  Procedure Laterality Date  . HIP SURGERY    . KNEE ARTHROSCOPY         Family History  Problem Relation Age of Onset  . Parkinson's disease Father   . Heart attack Father   . Colon cancer Neg Hx     Social History   Tobacco Use  . Smoking status: Never Smoker  . Smokeless tobacco: Never Used  Substance Use Topics  . Alcohol use: Yes    Alcohol/week: 2.0 standard drinks    Types: 2 Glasses of wine per week  . Drug use: No    Home Medications Prior to Admission medications   Medication Sig Start Date End Date Taking? Authorizing Provider  acetaminophen (TYLENOL) 325 MG tablet Take 650 mg by mouth every 6 (six) hours as needed for mild pain or headache.   Yes [provider]  atorvastatin (LIPITOR) 20 MG tablet Take 20 mg by mouth daily. 12/02/19  Yes [provider]    finasteride (PROPECIA) 1 MG tablet Take 1 mg by mouth daily. 12/07/19  Yes [provider]  hydrocortisone cream 1 % Apply 1 application topically 4 (four) times daily as needed for itching (armpit/elbow rashes).   Yes [provider]  sertraline (ZOLOFT) 50 MG tablet Take 50 mg by mouth daily.  03/23/19  Yes [provider]  tamsulosin (FLOMAX) 0.4 MG CAPS capsule Take 0.4 mg by mouth daily. 12/07/19  Yes [provider]  levofloxacin (LEVAQUIN) 500 MG tablet Take 1 tablet (500 mg total) by mouth daily. 12/13/19   Gailen Shelter, PA  ondansetron (ZOFRAN ODT) 4 MG disintegrating tablet Take 1 tablet (4 mg total) by mouth every 8 (eight) hours as needed for nausea or vomiting. 12/13/19   Gailen Shelter, PA  phenazopyridine (PYRIDIUM) 200 MG tablet Take 1 tablet (200 mg total) by mouth 3 (three) times daily. 12/13/19   Gailen Shelter, PA    Allergies    Patient has no known allergies.  Review of Systems   Review of Systems  Constitutional: Negative for chills and fever.  HENT: Negative for congestion.   Eyes: Negative for pain.  Respiratory: Negative for cough and shortness of breath.   Cardiovascular: Negative for chest pain and leg swelling.  Gastrointestinal: Positive for abdominal pain and nausea. Negative for vomiting.  Genitourinary: Positive for dysuria and testicular pain.  Musculoskeletal: Negative  for myalgias.  Skin: Negative for rash.  Neurological: Negative for dizziness and headaches.    Physical Exam Updated Vital Signs BP 107/61   Pulse 72   Temp 99 F (37.2 C) (Oral)   Resp 15   Ht 5\' 8"  (1.727 m)   Wt 68 kg   SpO2 96%   BMI 22.81 kg/m   Physical Exam Vitals and nursing note reviewed.  Constitutional:      General: He is not in acute distress.    Comments: Patient appears to be in pain but is not acute distress.  Pleasant, will answer questions appropriately and follow commands  HENT:     Head: Normocephalic and  atraumatic.     Nose: Nose normal.  Eyes:     General: No scleral icterus. Cardiovascular:     Rate and Rhythm: Normal rate and regular rhythm.     Pulses: Normal pulses.     Heart sounds: Normal heart sounds.  Pulmonary:     Effort: Pulmonary effort is normal. No respiratory distress.     Breath sounds: No wheezing.  Abdominal:     Palpations: Abdomen is soft.     Tenderness: There is no abdominal tenderness.     Hernia: No hernia is present.     Comments: No tenderness to palpation of the abdomen.  No CVA tenderness right flank.  No guarding, rebound, negative Murphy sign, McBurney sign, Rovsing, psoas  Genitourinary:    Comments: Deferred given prior evaluation by MD at [facility] Musculoskeletal:     Cervical back: Normal range of motion.     Right lower leg: No edema.     Left lower leg: No edema.  Skin:    General: Skin is warm and dry.     Capillary Refill: Capillary refill takes less than 2 seconds.  Neurological:     Mental Status: He is alert and oriented to person, place, and time. Mental status is at baseline.  Psychiatric:        Mood and Affect: Mood normal.        Behavior: Behavior normal.     ED Results / Procedures / Treatments   Labs (all labs ordered are listed, but only abnormal results are displayed) Labs Reviewed  URINALYSIS, ROUTINE W REFLEX MICROSCOPIC - Abnormal; Notable for the following components:      Result Value   APPearance CLOUDY (*)    Hgb urine dipstick MODERATE (*)    Ketones, ur 15 (*)    Nitrite POSITIVE (*)    Leukocytes,Ua MODERATE (*)    All other components within normal limits  CBC WITH DIFFERENTIAL/PLATELET - Abnormal; Notable for the following components:   WBC 14.0 (*)    Neutro Abs 12.4 (*)    All other components within normal limits  COMPREHENSIVE METABOLIC PANEL - Abnormal; Notable for the following components:   Sodium 132 (*)    Glucose, Bld 172 (*)    BUN 22 (*)    Alkaline Phosphatase 35 (*)    All other components  within normal limits  URINALYSIS, MICROSCOPIC (REFLEX) - Abnormal; Notable for the following components:   Bacteria, UA MANY (*)    All other components within normal limits  URINE CULTURE  LIPASE, BLOOD    EKG None  Radiology CT Renal Stone Study  Result Date: 12/13/2019 CLINICAL DATA:  Right abdominal pain radiating to the back and right scrotum. EXAM: CT ABDOMEN AND PELVIS WITHOUT CONTRAST TECHNIQUE: Multidetector CT imaging of the abdomen and pelvis  was performed following the standard protocol without IV contrast. COMPARISON:  None. FINDINGS: Lower chest: Clear lung bases.  Heart normal in size. Hepatobiliary: No focal liver abnormality is seen. No gallstones, gallbladder wall thickening, or biliary dilatation. Pancreas: Unremarkable. No pancreatic ductal dilatation or surrounding inflammatory changes. Spleen: Normal in size without focal abnormality. Adrenals/Urinary Tract: Adrenal glands are unremarkable. Kidneys are normal, without renal calculi, focal lesion, or hydronephrosis. Bladder is unremarkable. Stomach/Bowel: Normal stomach. Small bowel and colon are normal in caliber. No wall thickening. No inflammation. Mild generalized increased colonic stool burden. Appendix not definitively seen. No evidence of appendicitis. Vascular/Lymphatic: Single focus atherosclerotic calcification at the aortic bifurcation no other vascular abnormality. No enlarged lymph nodes. Reproductive: Unremarkable. Other: No abdominal wall hernia or abnormality. No abdominopelvic ascites. Musculoskeletal: Bilateral hip joint degenerative/arthropathic changes. No fracture or acute finding. No bone lesion. IMPRESSION: 1. No acute findings. No renal or ureteral stones. No findings to account the patient's symptoms. 2. Mild increased colonic stool burden.  No bowel inflammation. 3. Bilateral hip joint degenerative/arthropathic changes. No other abnormalities. Electronically Signed   By: Amie Portland M.D.   On: 12/13/2019  10:04   US SCROTUM W/DOPPLER  Result Date: 12/13/2019 CLINICAL DATA:  Right-sided scrotal pain. EXAM: SCROTAL ULTRASOUND DOPPLER ULTRASOUND OF THE TESTICLES TECHNIQUE: Complete ultrasound examination of the testicles, epididymis, and other scrotal structures was performed. Color and spectral Doppler ultrasound were also utilized to evaluate blood flow to the testicles. COMPARISON:  None. FINDINGS: Right testicle Measurements: 5.0 x 5.0 x 3.8 cm. Relatively enlarged. Evidence of mild edema. No mass or focal lesion. Relative increased blood flow compared to the left testicle. Left testicle Measurements: 4.8 x 1.9 x 2.6 cm. No mass or microlithiasis visualized. Right epididymis: Enlarged and hypervascular. Epididymal head cysts measuring 1.3 cm. Left epididymis:  Normal in size and appearance. Hydrocele:  Small right hydrocele. Varicocele:  Small left-sided varicocele. Pulsed Doppler interrogation of both testes demonstrates normal low resistance arterial and venous waveforms bilaterally. IMPRESSION: 1. Findings consistent with right epididymitis/orchitis. 2. No testicular masses.  No evidence of torsion. 3. Small right hydrocele and right epididymal head cyst. Electronically Signed   By: Amie Portland M.D.   On: 12/13/2019 09:06    Procedures Procedures (including critical care time)  Medications Ordered in ED Medications  morphine 4 MG/ML injection 4 mg (4 mg Intravenous Given 12/13/19 0625)  ondansetron (ZOFRAN) injection 4 mg (4 mg Intravenous Given 12/13/19 0625)  cefTRIAXone (ROCEPHIN) 1 g in sodium chloride 0.9 % 100 mL IVPB (0 g Intravenous Stopped 12/13/19 0913)  sodium chloride 0.9 % bolus 1,000 mL (0 mLs Intravenous Stopped 12/13/19 1029)  morphine 4 MG/ML injection 4 mg (4 mg Intravenous Given 12/13/19 0726)    ED Course  I have reviewed the triage vital signs and the nursing notes.  Pertinent labs & imaging results that were available during my care of the patient were reviewed by me and  considered in my medical decision making (see chart for details).  Patient seen [facility] hospital transferred by Dr. Elesa Massed for further evaluation.  Concern for UTI VS torsion VS epididymitis VS orchitis.  Clinical Course as of Dec 12 1608  Wed Dec 13, 2019  0722 RBC: 4.47 [WF]  1015 Call pharmacy discussed treating possible concomitant complicated UTI and epididymitis/orchitis.  Levofloxacin or ciprofloxacin could be used.  We will treat patient with levofloxacin.  He has urinalysis/urine culture in the past which showed E. Coli which is sensitive to levofloxacin.  Discussed complications  of levofloxacin/blackbox warning with patient.  He is understanding of precautions.   [WF]    Clinical Course User Index [WF] Gailen Shelter, [place]   CBC shows neutrophil predominant leukocytosis.  CMP no acute abnormality apart from very mild BUN elevation consistent of somewhat mild dehydration.  He is tolerating p.o. without difficulty.  Mild hyponatremia.  Patient provided with 1 L normal saline.  Analysis shows cloudy urine with moderate blood, ketones, nitrates and leukocytes.  There is also many bacteria.  Squamous epithelium to indicate contamination.  Will obtain CT scan to rule out infected stone as there is hematuria.  However ultrasound shows evidence of epididymitis.  No evidence of torsion.  CT scan independently reviewed myself shows no evidence of stone.  Also no evidence of perinephric stranding or any indication of pyelonephritis as patient has no fever or CVA tenderness I doubt this is an etiology patient symptoms.  Discussed this case with my attending physician Dr. Charm Barges who agrees with plan to discharge with levofloxacin.  Patient will follow closely with his urologist.  MDM Rules/Calculators/A&P                       Final Clinical Impression(s) / ED Diagnoses Final diagnoses:  Right testicular pain  Epididymitis  Dysuria    Rx / DC Orders ED Discharge Orders         Ordered     phenazopyridine (PYRIDIUM) 200 MG tablet  3 times daily     12/13/19 1019    levofloxacin (LEVAQUIN) 500 MG tablet  Daily     12/13/19 1019    ondansetron (ZOFRAN ODT) 4 MG disintegrating tablet  Every 8 hours PRN     12/13/19 1033           Gailen Shelter, [place] 12/13/19 1611    Terrilee Files, MD 12/13/19 1758

## 2019-12-13 NOTE — ED Notes (Signed)
Ultrasound at bedside

## 2019-12-13 NOTE — ED Triage Notes (Signed)
Pt c/o right abd pain radiating to back and right testicle.

## 2019-12-13 NOTE — Discharge Instructions (Addendum)
Please take levofloxacin as prescribed 500 mg once daily for the next 10 days.  Please follow-up with your urologist for follow up on your urine culture and for re-evaluation--you may make appointment for 1 week or earlier if preferred.  Please continue using Flomax.

## 2019-12-15 LAB — URINE CULTURE: Culture: >=100000 — AB

## 2019-12-17 ENCOUNTER — Telehealth: Payer: Self-pay | Admitting: Emergency Medicine

## 2019-12-17 NOTE — Telephone Encounter (Signed)
Post ED Visit - Positive Culture Follow-up  Culture report reviewed by antimicrobial stewardship pharmacist: [facility] []  , Pharm.D. []  Enzo Bi, .D., BCPS AQ-ID []  Celedonio Miyamoto, Pharm.D., BCPS []  [address], Pharm.D., BCPS []  [place], Garvin Fila.D., BCPS, AAHIVP []  , Pharm.D., BCPS, AAHIVP []  [place]ina Pillion, PharmD, BCPS []  , PharmD, BCPS []  [place], PharmD, BCPS []  [address], PharmD []  , PharmD, BCPS []  Estella Husk, PharmD  Pharmacy Team []  Lysle Pearl, PharmD []  , PharmD []  Phillips Climes, PharmD []  , Rph []  Agapito Games) , PharmD []  Verlan Friends, PharmD []  , PharmD []  Mervyn Gay, PharmD []  , PharmD []  Vinnie Level, PharmD []  [facility], PharmD []  , PharmD [x]  Len Childs, PharmD   Positive urine culture Treated with Levofloxacin, organism sensitive to the same and no further patient follow-up is required at this time.  [name] 12/17/2019, 1:09 PM

## 2020-06-03 ENCOUNTER — Other Ambulatory Visit: Payer: Self-pay

## 2020-06-03 ENCOUNTER — Other Ambulatory Visit (HOSPITAL_BASED_OUTPATIENT_CLINIC_OR_DEPARTMENT_OTHER): Payer: Self-pay | Admitting: Internal Medicine

## 2020-06-03 ENCOUNTER — Ambulatory Visit: Payer: PRIVATE HEALTH INSURANCE | Attending: Internal Medicine

## 2020-06-03 DIAGNOSIS — Z23 Encounter for immunization: Secondary | ICD-10-CM

## 2020-06-03 MED FILL — PFIZER-BIONTECH COVID-19 VA: 30 | 1 days supply | Qty: 0 | Fill #0

## 2020-06-03 NOTE — Progress Notes (Signed)
   Covid-19 Vaccination Clinic  Name:  [name]    MRN: [id number] DOB: [birth year]  06/03/2020  [name] was observed post Covid-19 immunization for 15 minutes without incident. He was provided with Vaccine Information Sheet and instruction to access the V-Safe system. Vaccinated by Theodis Sato.  [name] was instructed to call 911 with any severe reactions post vaccine: Marland Kitchen Difficulty breathing  . Swelling of face and throat  . A fast heartbeat  . A bad rash all over body  . Dizziness and weakness

## 2021-06-07 IMAGING — US US SCROTUM W/ DOPPLER COMPLETE
1 series · 14 of 25 positions shown · non-contrast
Comparison: None.

CLINICAL DATA: Right-sided scrotal pain.

EXAM:
SCROTAL ULTRASOUND
DOPPLER ULTRASOUND OF THE TESTICLES
TECHNIQUE: Complete ultrasound examination of the testicles, epididymis, and
other scrotal structures was performed. Color and spectral Doppler
ultrasound were also utilized to evaluate blood flow to the
testicles.

[Series 1: us scrotum w/ doppler complete · 14 of 69 slices shown]
[im 1/69]
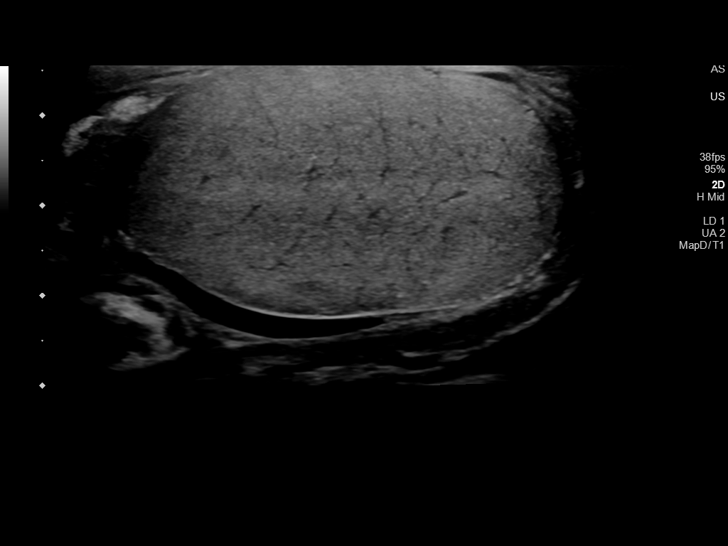
[im 6/69]
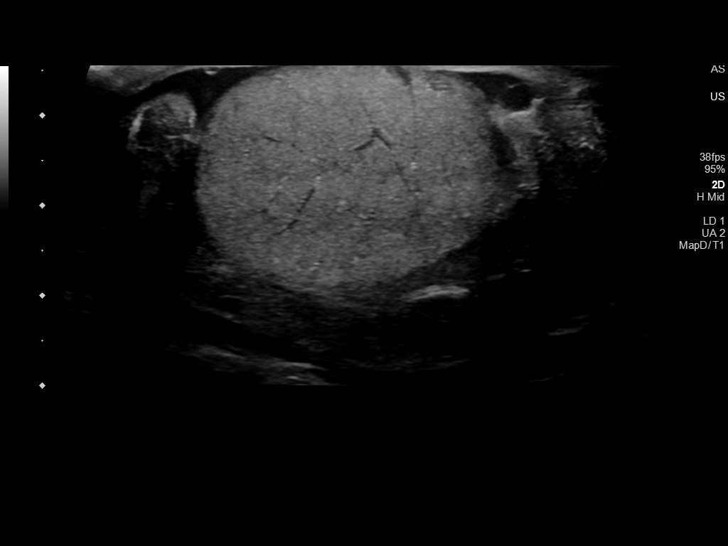
[im 12/69]
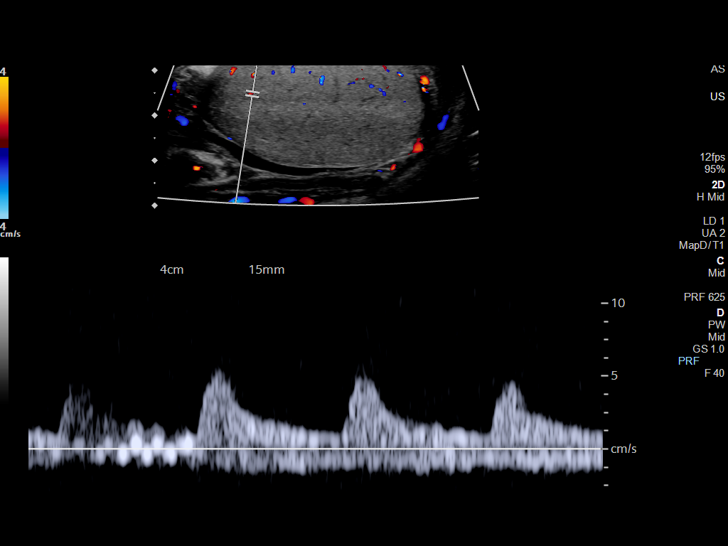
[im 18/69]
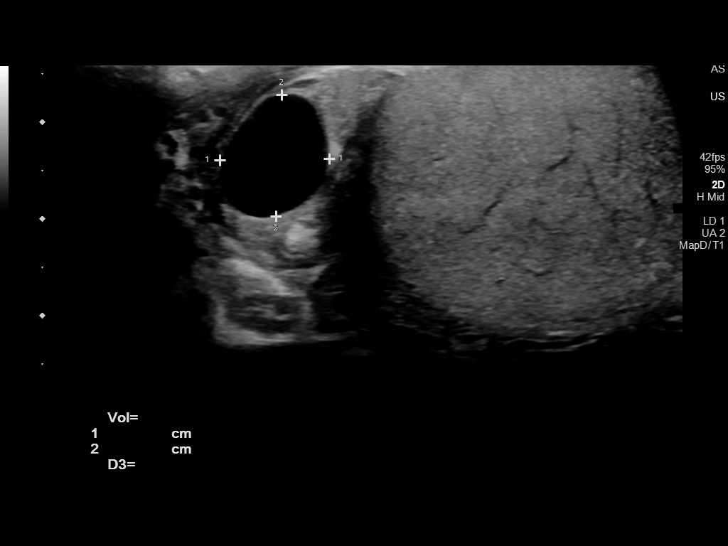
[im 23/69]
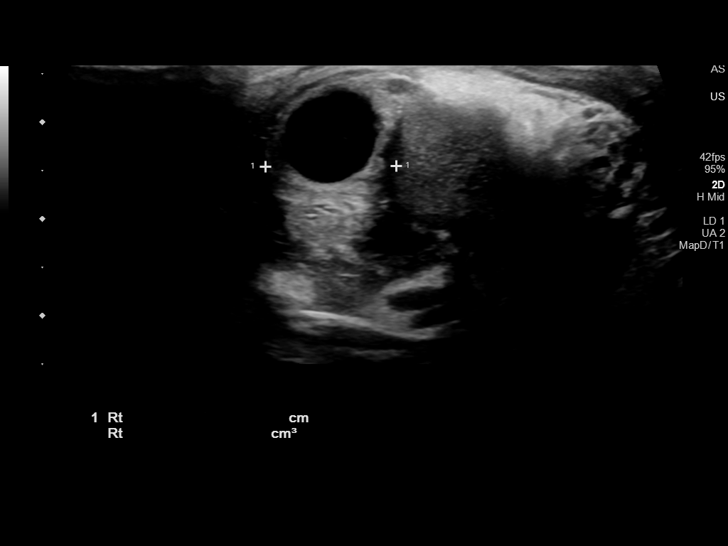
[im 26/69]
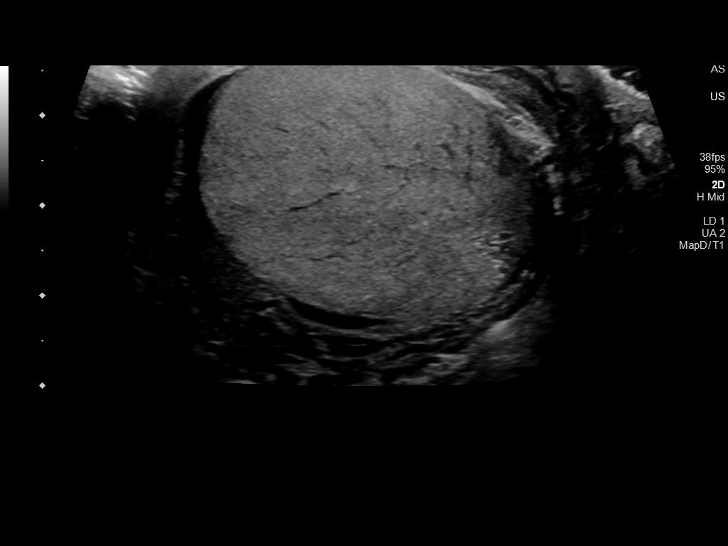
[im 32/69]
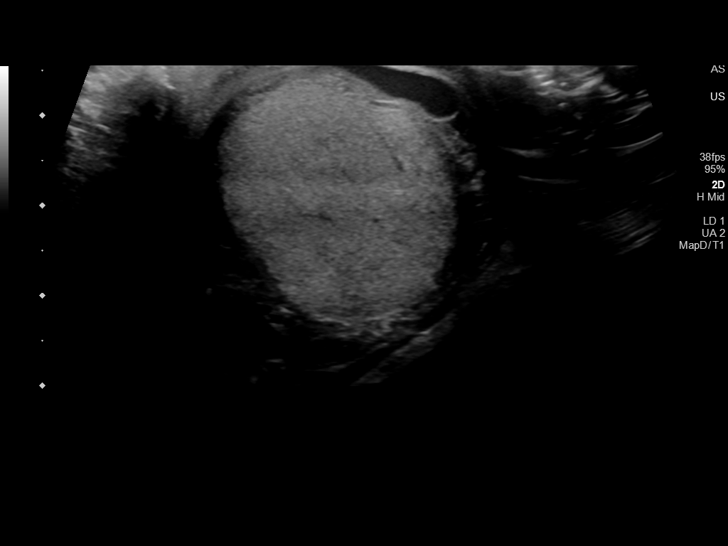
[im 37/69]
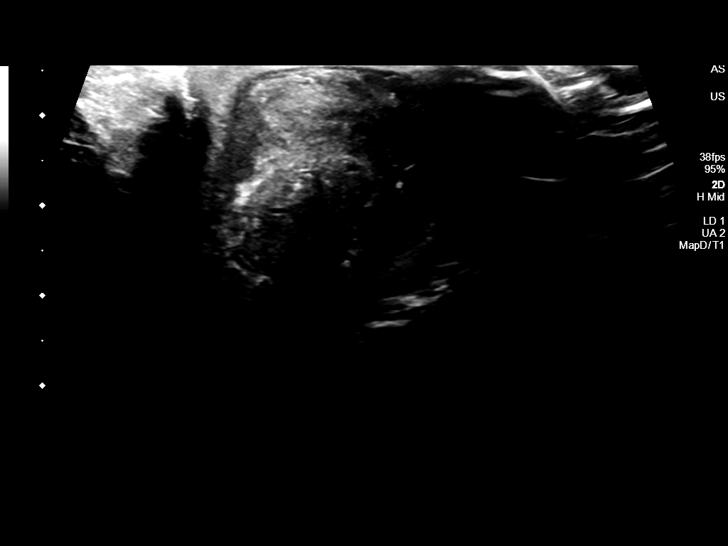
[im 43/69]
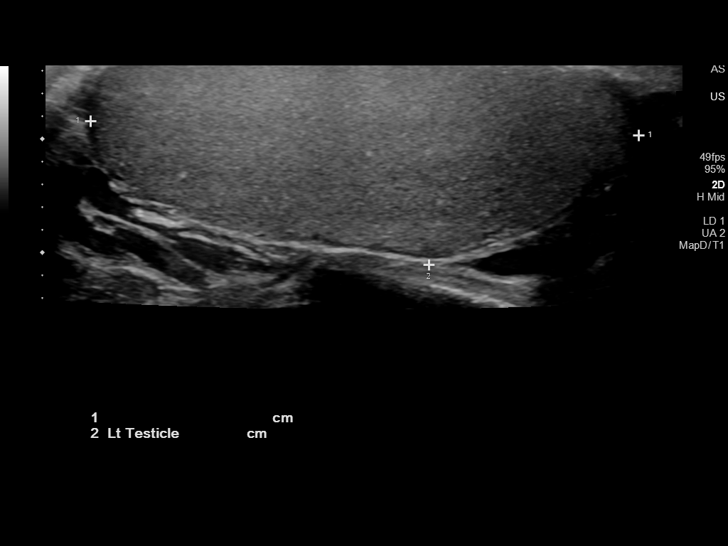
[im 46/69]
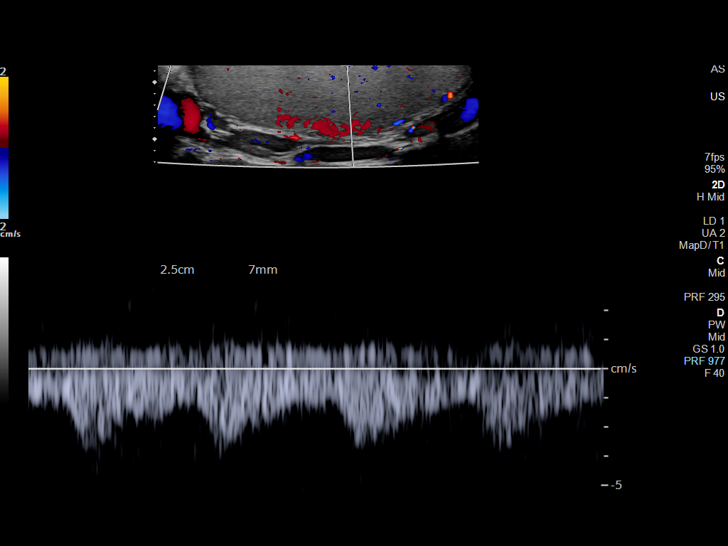
[im 52/69]
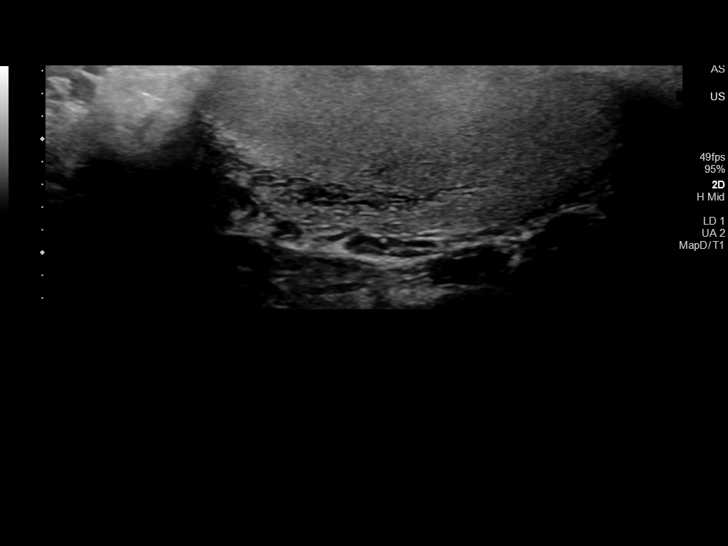
[im 57/69]
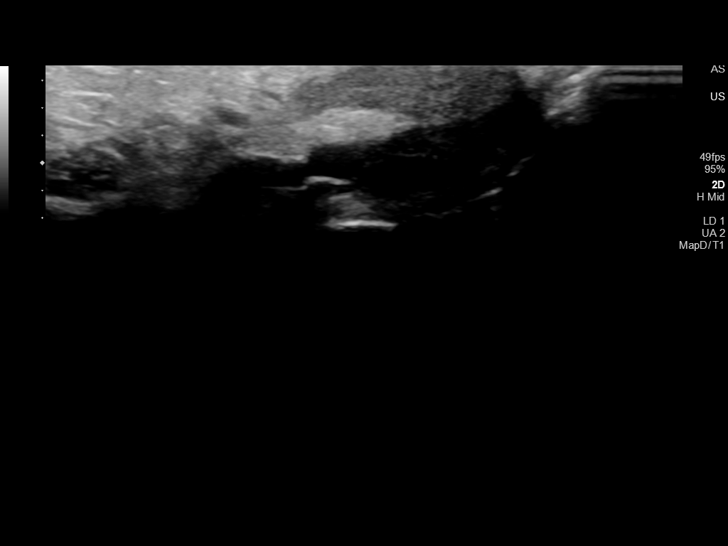
[im 63/69]
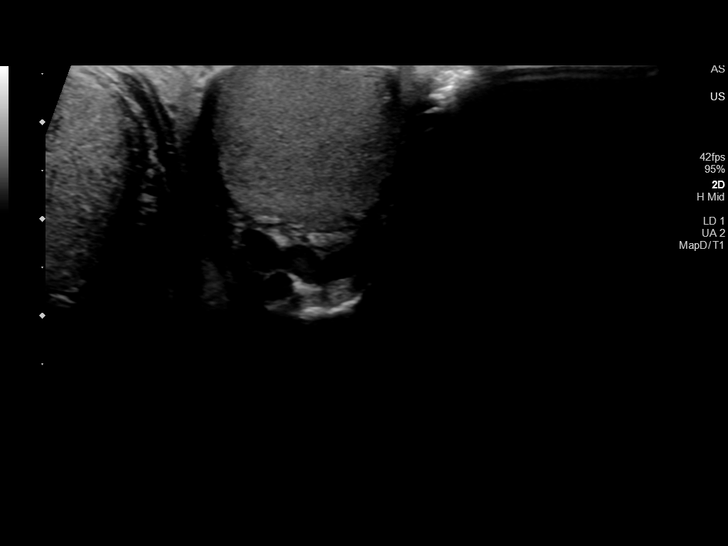
[im 69/69]
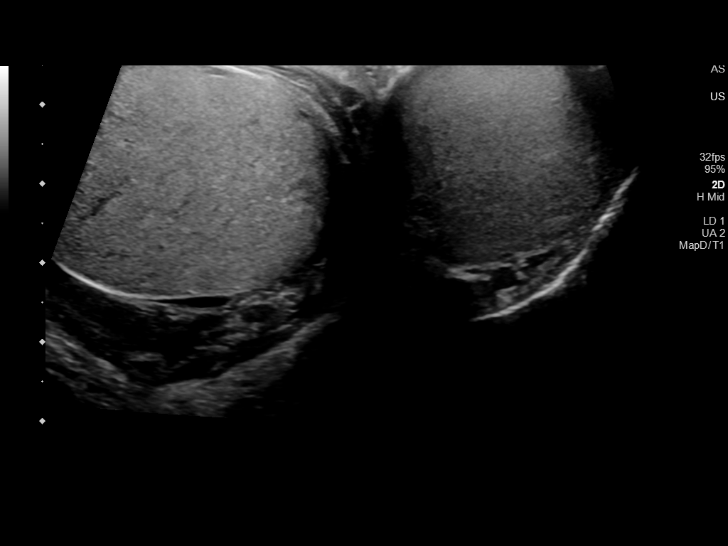

[14 of 25 positions shown; findings below may reference images not displayed]

FINDINGS: Right testicle

Measurements: 5.0 x 5.0 x 3.8 cm. Relatively enlarged. Evidence of
mild edema. No mass or focal lesion. Relative increased blood flow
compared to the left testicle.

Left testicle

Measurements: 4.8 x 1.9 x 2.6 cm. No mass or microlithiasis
visualized.

Right epididymis: Enlarged and hypervascular. Epididymal head cysts
measuring 1.3 cm.

Left epididymis:  Normal in size and appearance.

Hydrocele:  Small right hydrocele.

Varicocele:  Small left-sided varicocele.

Pulsed Doppler interrogation of both testes demonstrates normal low
resistance arterial and venous waveforms bilaterally.
IMPRESSION: 1. Findings consistent with right epididymitis/orchitis.
2. No testicular masses.  No evidence of torsion.
3. Small right hydrocele and right epididymal head cyst.

## 2021-07-25 ENCOUNTER — Other Ambulatory Visit (HOSPITAL_COMMUNITY): Payer: Self-pay

## 2021-12-24 ENCOUNTER — Other Ambulatory Visit (HOSPITAL_BASED_OUTPATIENT_CLINIC_OR_DEPARTMENT_OTHER): Payer: Self-pay

## 2021-12-24 MED ORDER — ATORVASTATIN CALCIUM 20 MG PO TABS
20.0000 mg | ORAL_TABLET | Freq: Every day | ORAL | 12 refills | Status: DC
  Filled 2021-12-24: qty 90, 90d supply, fill #0

## 2021-12-24 MED ORDER — TAMSULOSIN HCL 0.4 MG PO CAPS
0.4000 mg | ORAL_CAPSULE | Freq: Every day | ORAL | 35 refills | Status: DC
  Filled 2021-12-24: qty 90, 90d supply, fill #0

## 2021-12-30 ENCOUNTER — Other Ambulatory Visit (HOSPITAL_BASED_OUTPATIENT_CLINIC_OR_DEPARTMENT_OTHER): Payer: Self-pay

## 2021-12-31 ENCOUNTER — Other Ambulatory Visit (HOSPITAL_BASED_OUTPATIENT_CLINIC_OR_DEPARTMENT_OTHER): Payer: Self-pay

## 2021-12-31 MED ORDER — SERTRALINE HCL 50 MG PO TABS
ORAL_TABLET | ORAL | 3 refills | Status: DC
  Filled 2021-12-31: qty 90, 90d supply, fill #0
  Filled 2022-04-06: qty 90, 90d supply, fill #1
  Filled 2022-06-30: qty 90, 90d supply, fill #2
  Filled 2022-10-20: qty 90, 90d supply, fill #3

## 2022-01-27 ENCOUNTER — Other Ambulatory Visit (HOSPITAL_BASED_OUTPATIENT_CLINIC_OR_DEPARTMENT_OTHER): Payer: Self-pay

## 2022-01-27 ENCOUNTER — Telehealth: Payer: 59 | Admitting: Physician Assistant

## 2022-01-27 DIAGNOSIS — H0016 Chalazion left eye, unspecified eyelid: Secondary | ICD-10-CM | POA: Diagnosis not present

## 2022-01-27 MED ORDER — POLYMYXIN B-TRIMETHOPRIM 10000-0.1 UNIT/ML-% OP SOLN
1.0000 [drp] | Freq: Four times a day (QID) | OPHTHALMIC | 0 refills | Status: DC
  Filled 2022-01-27: qty 10, 5d supply, fill #0

## 2022-01-27 MED ORDER — NEOMYCIN-POLYMYXIN-HC 3.5-10000-1 OP SUSP
OPHTHALMIC | 0 refills | Status: DC
  Filled 2022-01-27: qty 7.5, 8d supply, fill #0

## 2022-01-27 NOTE — Progress Notes (Signed)
  E-Visit for Stye   We are sorry that you are not feeling well. Here is how we plan to help!  Based on what you have shared with me it looks like you have a stye.  A stye is an inflammation of the eyelid.  It is often a red, painful lump near the edge of the eyelid that may look like a boil or a pimple.  A stye develops when an infection occurs at the base of an eyelash.   We have made appropriate suggestions for you based upon your presentation: Your symptoms may indicate an infection of the sclera.  The use of anti-inflammatory and antibiotic eye drops for a week will help resolve this condition.  I have sent in neomycin-polymyxin HC opthalmic suspension, two to three drops in the affected eye every 4 hours.  If your symptoms do not improve over the next two to three days you should be seen in your doctor's office.  HOME CARE:  Wash your hands often! Let the stye open on its own. Don't squeeze or open it. Don't rub your eyes. This can irritate your eyes and let in bacteria.  If you need to touch your eyes, wash your hands first. Don't wear eye makeup or contact lenses until the area has healed.  GET HELP RIGHT AWAY IF:  Your symptoms do not improve. You develop blurred or loss of vision. Your symptoms worsen (increased discharge, pain or redness).   Thank you for choosing an e-visit.  Your e-visit answers were reviewed by a board certified advanced clinical practitioner to complete your personal care plan. Depending upon the condition, your plan could have included both over the counter or prescription medications.  Please review your pharmacy choice. Make sure the pharmacy is open so you can pick up prescription now. If there is a problem, you may contact your provider through MyChart messaging and have the prescription routed to another pharmacy.  Your safety is important to us. If you have drug allergies check your prescription carefully.   For the next 24 hours you can use  MyChart to ask questions about today's visit, request a non-urgent call back, or ask for a work or school excuse. You will get an email in the next two days asking about your experience. I hope that your e-visit has been valuable and will speed your recovery.  

## 2022-01-27 NOTE — Progress Notes (Signed)
I have spent 5 minutes in review of e-visit questionnaire, review and updating patient chart, medical decision making and response to patient.   Teryn Gust Cody Safal Halderman, PA-C    

## 2022-01-27 NOTE — Addendum Note (Signed)
Addended by: Brunetta Jeans on: 01/27/2022 03:23 PM   Modules accepted: Orders

## 2022-04-06 ENCOUNTER — Other Ambulatory Visit (HOSPITAL_BASED_OUTPATIENT_CLINIC_OR_DEPARTMENT_OTHER): Payer: Self-pay

## 2022-04-07 ENCOUNTER — Other Ambulatory Visit (HOSPITAL_BASED_OUTPATIENT_CLINIC_OR_DEPARTMENT_OTHER): Payer: Self-pay

## 2022-04-07 MED ORDER — TAMSULOSIN HCL 0.4 MG PO CAPS
0.4000 mg | ORAL_CAPSULE | Freq: Every day | ORAL | 4 refills | Status: DC
  Filled 2022-04-07: qty 90, 90d supply, fill #0
  Filled 2022-06-30 – 2023-01-21 (×2): qty 90, 90d supply, fill #1

## 2022-04-07 MED ORDER — ATORVASTATIN CALCIUM 20 MG PO TABS
20.0000 mg | ORAL_TABLET | Freq: Every day | ORAL | 12 refills | Status: DC
  Filled 2022-04-07: qty 30, 30d supply, fill #0
  Filled 2022-06-30: qty 30, 30d supply, fill #1
  Filled 2022-07-27: qty 30, 30d supply, fill #2
  Filled 2023-01-21 – 2023-03-10 (×2): qty 30, 30d supply, fill #3

## 2022-04-08 ENCOUNTER — Other Ambulatory Visit (HOSPITAL_BASED_OUTPATIENT_CLINIC_OR_DEPARTMENT_OTHER): Payer: Self-pay

## 2022-04-15 ENCOUNTER — Telehealth: Payer: 59 | Admitting: Physician Assistant

## 2022-04-15 ENCOUNTER — Other Ambulatory Visit (HOSPITAL_BASED_OUTPATIENT_CLINIC_OR_DEPARTMENT_OTHER): Payer: Self-pay

## 2022-04-15 DIAGNOSIS — H00012 Hordeolum externum right lower eyelid: Secondary | ICD-10-CM

## 2022-04-15 MED ORDER — AMOXICILLIN-POT CLAVULANATE 875-125 MG PO TABS
1.0000 | ORAL_TABLET | Freq: Two times a day (BID) | ORAL | 0 refills | Status: DC
  Filled 2022-04-15: qty 20, 10d supply, fill #0

## 2022-04-15 NOTE — Progress Notes (Signed)
Virtual Visit Consent   [name], you are scheduled for a virtual visit with a [facility] provider today. Just as with appointments in the office, your consent must be obtained to participate. Your consent will be active for this visit and any virtual visit you may have with one of our providers in the next 365 days. If you have a MyChart account, a copy of this consent can be sent to you electronically.  As this is a virtual visit, video technology does not allow for your provider to perform a traditional examination. This may limit your provider's ability to fully assess your condition. If your provider identifies any concerns that need to be evaluated in person or the need to arrange testing (such as labs, EKG, etc.), we will make arrangements to do so. Although advances in technology are sophisticated, we cannot ensure that it will always work on either your end or our end. If the connection with a video visit is poor, the visit may have to be switched to a telephone visit. With either a video or telephone visit, we are not always able to ensure that we have a secure connection.  By engaging in this virtual visit, you consent to the provision of healthcare and authorize for your insurance to be billed (if applicable) for the services provided during this visit. Depending on your insurance coverage, you may receive a charge related to this service.  I need to obtain your verbal consent now. Are you willing to proceed with your visit today? [name] has provided verbal consent on 04/15/2022 for a virtual visit (video or telephone). Piedad Climes, [place]  Date: 04/15/2022 8:28 AM  Virtual Visit via Video Note   I, Piedad Climes, connected with  [name]  ([id number], 11-17-1960) on 04/15/22 at  8:15 AM EDT by a video-enabled telemedicine application and verified that I am speaking with the correct person using two identifiers.  Location: Patient: Virtual Visit  Location Patient: Home Provider: Virtual Visit Location Provider: Home Office   I discussed the limitations of evaluation and management by telemedicine and the availability of in person appointments. The patient expressed understanding and agreed to proceed.    History of Present Illness: [name] is a 61 y.o. who identifies as a male who was assigned male at birth, and is being seen today for for a stye of R lower eyelid. Patient was seen in June for a small stye of upper eyelid, treated with Polytrim with full resolution. Over the past week noting a new stye of R lower eyelid that has quickly become tender and painful. Squeezed the area with  purulent drainage. Noting some redness of the lower eyelid as well. Denies fever, chills, vision change. Denies any pain with EOM. Wears glasses.  HPI: HPI  Problems: There are no problems to display for this patient.   Allergies: No Known Allergies Medications:  Current Outpatient Medications:    amoxicillin-clavulanate (AUGMENTIN) 875-125 MG tablet, Take 1 tablet by mouth 2 (two) times daily., Disp: 20 tablet, Rfl: 0   atorvastatin (LIPITOR) 20 MG tablet, Take 1 tablet (20 mg total) by mouth daily., Disp: 30 tablet, Rfl: 12   finasteride (PROPECIA) 1 MG tablet, Take 1 mg by mouth daily., Disp: , Rfl:    hydrocortisone cream 1 %, Apply 1 application topically 4 (four) times daily as needed for itching (armpit/elbow rashes)., Disp: , Rfl:    sertraline (ZOLOFT) 50 MG tablet, Take 50 mg by  mouth daily. , Disp: , Rfl:    sertraline (ZOLOFT) 50 MG tablet, TAKE 1 TABLET BY MOUTH EVERY DAY, Disp: 90 tablet, Rfl: 3   tamsulosin (FLOMAX) 0.4 MG CAPS capsule, Take 1 capsule (0.4 mg total) by mouth daily., Disp: 90 capsule, Rfl: 4  Observations/Objective: Patient is well-developed, well-nourished in no acute distress.  Resting comfortably at home.  Head is normocephalic, atraumatic.  No labored breathing. Speech is clear and coherent with logical  content.  Patient is alert and oriented at baseline.  R lower eyelid with visible swelling, redness and stye present. Pupils are equal and round. EOMI without pain. L conjunctiva and eyelid within normal limits.  Assessment and Plan: 1. Hordeolum externum of right lower eyelid - amoxicillin-clavulanate (AUGMENTIN) 875-125 MG tablet; Take 1 tablet by mouth 2 (two) times daily.  Dispense: 20 tablet; Refill: 0  Significant tenderness and with some involvement of the entire lower eyelid. Start warm compresses. Will start oral antibiotic therapy instead of topical to be more aggressive. Rx Augmentin BID x 10 days. Strict in-person follow up discussed. ER precautions reviewed.  Follow Up Instructions: I discussed the assessment and treatment plan with the patient. The patient was provided an opportunity to ask questions and all were answered. The patient agreed with the plan and demonstrated an understanding of the instructions.  A copy of instructions were sent to the patient via MyChart unless otherwise noted below.   The patient was advised to call back or seek an in-person evaluation if the symptoms worsen or if the condition fails to improve as anticipated.  Time:  I spent 10 minutes with the patient via telehealth technology discussing the above problems/concerns.    Piedad Climes, PA-C

## 2022-04-15 NOTE — Patient Instructions (Signed)
  [name], thank you for joining Piedad Climes, PA-C for today's virtual visit.  While this provider is not your primary care provider (PCP), if your PCP is located in our provider database this encounter information will be shared with them immediately following your visit.  Consent: (Patient) [name] provided verbal consent for this virtual visit at the beginning of the encounter.  Current Medications:  Current Outpatient Medications:    acetaminophen (TYLENOL) 325 MG tablet, Take 650 mg by mouth every 6 (six) hours as needed for mild pain or headache., Disp: , Rfl:    atorvastatin (LIPITOR) 20 MG tablet, Take 20 mg by mouth daily., Disp: , Rfl:    atorvastatin (LIPITOR) 20 MG tablet, Take 1 tablet (20 mg total) by mouth daily., Disp: 30 tablet, Rfl: 12   finasteride (PROPECIA) 1 MG tablet, Take 1 mg by mouth daily., Disp: , Rfl:    hydrocortisone cream 1 %, Apply 1 application topically 4 (four) times daily as needed for itching (armpit/elbow rashes)., Disp: , Rfl:    levofloxacin (LEVAQUIN) 500 MG tablet, Take 1 tablet (500 mg total) by mouth daily., Disp: 10 tablet, Rfl: 0   ondansetron (ZOFRAN ODT) 4 MG disintegrating tablet, Take 1 tablet (4 mg total) by mouth every 8 (eight) hours as needed for nausea or vomiting., Disp: 20 tablet, Rfl: 0   phenazopyridine (PYRIDIUM) 200 MG tablet, Take 1 tablet (200 mg total) by mouth 3 (three) times daily., Disp: 6 tablet, Rfl: 0   sertraline (ZOLOFT) 50 MG tablet, Take 50 mg by mouth daily. , Disp: , Rfl:    sertraline (ZOLOFT) 50 MG tablet, TAKE 1 TABLET BY MOUTH EVERY DAY, Disp: 90 tablet, Rfl: 3   tamsulosin (FLOMAX) 0.4 MG CAPS capsule, Take 0.4 mg by mouth daily., Disp: , Rfl:    tamsulosin (FLOMAX) 0.4 MG CAPS capsule, Take 1 capsule (0.4 mg total) by mouth daily., Disp: 90 capsule, Rfl: 4   trimethoprim-polymyxin b (POLYTRIM) ophthalmic solution, Place 1-2 drops into the affected eye 4 (four) times daily for 5 days., Disp:  10 mL, Rfl: 0   Medications ordered in this encounter:  No orders of the defined types were placed in this encounter.    *If you need refills on other medications prior to your next appointment, please contact your pharmacy*  Follow-Up: Call back or seek an in-person evaluation if the symptoms worsen or if the condition fails to improve as anticipated.  Other Instructions Please keep hands washed and avoid touching/squeezing the area. Warm compresses as discussed. Take the oral antibiotic as directed. If not substantially improving over next few days or any new/worsening symptoms despite treatment, you need an in-person evaluation ASAP. DO NOT DELAY CARE   If you have been instructed to have an in-person evaluation today at a local Urgent Care facility, please use the link below. It will take you to a list of all of our available [facility], including address, phone number and hours of operation. Please do not delay care.  [facility]  If you or a family member do not have a primary care provider, use the link below to schedule a visit and establish care. When you choose a [facility] primary care physician or advanced practice provider, you gain a long-term partner in health. Find a Primary Care Provider  Learn more about [facility] in-office and virtual care options:  - Get Care Now

## 2022-06-09 ENCOUNTER — Other Ambulatory Visit (HOSPITAL_BASED_OUTPATIENT_CLINIC_OR_DEPARTMENT_OTHER): Payer: Self-pay

## 2022-06-09 MED ORDER — TAMSULOSIN HCL 0.4 MG PO CAPS
0.4000 mg | ORAL_CAPSULE | Freq: Two times a day (BID) | ORAL | 3 refills | Status: DC
  Filled 2022-06-09: qty 180, 90d supply, fill #0
  Filled 2022-10-20: qty 180, 90d supply, fill #1
  Filled 2022-12-07 – 2023-03-10 (×2): qty 180, 90d supply, fill #2

## 2022-07-01 ENCOUNTER — Other Ambulatory Visit (HOSPITAL_BASED_OUTPATIENT_CLINIC_OR_DEPARTMENT_OTHER): Payer: Self-pay

## 2022-07-27 ENCOUNTER — Other Ambulatory Visit (HOSPITAL_BASED_OUTPATIENT_CLINIC_OR_DEPARTMENT_OTHER): Payer: Self-pay

## 2022-08-27 ENCOUNTER — Other Ambulatory Visit (HOSPITAL_BASED_OUTPATIENT_CLINIC_OR_DEPARTMENT_OTHER): Payer: Self-pay

## 2022-08-27 MED ORDER — ATORVASTATIN CALCIUM 20 MG PO TABS
20.0000 mg | ORAL_TABLET | Freq: Every day | ORAL | 3 refills | Status: DC
  Filled 2022-08-27: qty 90, 90d supply, fill #0
  Filled 2022-10-20 – 2022-12-07 (×2): qty 90, 90d supply, fill #1
  Filled 2023-04-05: qty 90, 90d supply, fill #2
  Filled 2023-07-12 (×2): qty 90, 90d supply, fill #3

## 2022-10-20 ENCOUNTER — Other Ambulatory Visit (HOSPITAL_BASED_OUTPATIENT_CLINIC_OR_DEPARTMENT_OTHER): Payer: Self-pay

## 2022-10-20 ENCOUNTER — Other Ambulatory Visit: Payer: Self-pay

## 2022-10-20 MED ORDER — SYSTANE NIGHTTIME OP OINT
1.0000 | TOPICAL_OINTMENT | Freq: Every day | OPHTHALMIC | 11 refills | Status: DC
  Filled 2022-10-20: qty 3.5, fill #0
  Filled 2022-10-20: qty 3.5, 14d supply, fill #0

## 2022-10-20 MED ORDER — SYSTANE 0.4-0.3 % OP SOLN
1.0000 [drp] | Freq: Two times a day (BID) | OPHTHALMIC | 11 refills | Status: DC
  Filled 2022-10-20: qty 10, 14d supply, fill #0
  Filled 2022-10-20: qty 10, fill #0

## 2022-10-20 MED ORDER — DOXYCYCLINE HYCLATE 100 MG PO CAPS
100.0000 mg | ORAL_CAPSULE | Freq: Two times a day (BID) | ORAL | 0 refills | Status: AC
  Filled 2022-10-20: qty 14, 7d supply, fill #0

## 2022-10-23 ENCOUNTER — Other Ambulatory Visit (HOSPITAL_BASED_OUTPATIENT_CLINIC_OR_DEPARTMENT_OTHER): Payer: Self-pay

## 2022-10-26 ENCOUNTER — Other Ambulatory Visit (HOSPITAL_BASED_OUTPATIENT_CLINIC_OR_DEPARTMENT_OTHER): Payer: Self-pay

## 2022-12-08 ENCOUNTER — Other Ambulatory Visit (HOSPITAL_BASED_OUTPATIENT_CLINIC_OR_DEPARTMENT_OTHER): Payer: Self-pay

## 2022-12-08 ENCOUNTER — Other Ambulatory Visit: Payer: Self-pay

## 2023-01-21 ENCOUNTER — Other Ambulatory Visit: Payer: Self-pay

## 2023-01-21 ENCOUNTER — Other Ambulatory Visit (HOSPITAL_BASED_OUTPATIENT_CLINIC_OR_DEPARTMENT_OTHER): Payer: Self-pay

## 2023-01-22 ENCOUNTER — Other Ambulatory Visit (HOSPITAL_BASED_OUTPATIENT_CLINIC_OR_DEPARTMENT_OTHER): Payer: Self-pay

## 2023-01-22 ENCOUNTER — Other Ambulatory Visit: Payer: Self-pay

## 2023-01-22 MED ORDER — SERTRALINE HCL 50 MG PO TABS
50.0000 mg | ORAL_TABLET | Freq: Every day | ORAL | 3 refills | Status: DC
  Filled 2023-01-22: qty 90, 90d supply, fill #0
  Filled 2023-04-14: qty 90, 90d supply, fill #1
  Filled 2023-07-12: qty 90, 90d supply, fill #2
  Filled 2023-10-06: qty 80, 80d supply, fill #3
  Filled 2023-12-29: qty 10, 10d supply, fill #4

## 2023-02-24 ENCOUNTER — Other Ambulatory Visit (HOSPITAL_BASED_OUTPATIENT_CLINIC_OR_DEPARTMENT_OTHER): Payer: Self-pay

## 2023-02-24 MED ORDER — DOXYCYCLINE MONOHYDRATE 100 MG PO CAPS
100.0000 mg | ORAL_CAPSULE | Freq: Two times a day (BID) | ORAL | 0 refills | Status: DC
  Filled 2023-02-24: qty 20, 10d supply, fill #0

## 2023-03-10 ENCOUNTER — Other Ambulatory Visit (HOSPITAL_BASED_OUTPATIENT_CLINIC_OR_DEPARTMENT_OTHER): Payer: Self-pay

## 2023-04-01 DIAGNOSIS — M1611 Unilateral primary osteoarthritis, right hip: Secondary | ICD-10-CM | POA: Diagnosis not present

## 2023-04-05 ENCOUNTER — Other Ambulatory Visit (HOSPITAL_BASED_OUTPATIENT_CLINIC_OR_DEPARTMENT_OTHER): Payer: Self-pay

## 2023-04-15 ENCOUNTER — Other Ambulatory Visit (HOSPITAL_BASED_OUTPATIENT_CLINIC_OR_DEPARTMENT_OTHER): Payer: Self-pay

## 2023-05-24 ENCOUNTER — Other Ambulatory Visit (HOSPITAL_BASED_OUTPATIENT_CLINIC_OR_DEPARTMENT_OTHER): Payer: Self-pay

## 2023-05-24 MED ORDER — FINASTERIDE 1 MG PO TABS
1.0000 mg | ORAL_TABLET | Freq: Every day | ORAL | 4 refills | Status: DC
  Filled 2023-05-24 – 2023-07-14 (×3): qty 90, 90d supply, fill #0
  Filled 2023-09-29: qty 90, 90d supply, fill #1
  Filled 2023-12-28: qty 90, 90d supply, fill #2
  Filled 2024-03-27: qty 90, 90d supply, fill #3

## 2023-06-08 DIAGNOSIS — Z0100 Encounter for examination of eyes and vision without abnormal findings: Secondary | ICD-10-CM | POA: Diagnosis not present

## 2023-06-10 ENCOUNTER — Other Ambulatory Visit (HOSPITAL_BASED_OUTPATIENT_CLINIC_OR_DEPARTMENT_OTHER): Payer: Self-pay

## 2023-06-10 DIAGNOSIS — Z01118 Encounter for examination of ears and hearing with other abnormal findings: Secondary | ICD-10-CM | POA: Diagnosis not present

## 2023-06-10 DIAGNOSIS — H903 Sensorineural hearing loss, bilateral: Secondary | ICD-10-CM | POA: Diagnosis not present

## 2023-06-10 DIAGNOSIS — H8101 Meniere's disease, right ear: Secondary | ICD-10-CM | POA: Diagnosis not present

## 2023-06-10 DIAGNOSIS — H9193 Unspecified hearing loss, bilateral: Secondary | ICD-10-CM | POA: Insufficient documentation

## 2023-06-10 MED ORDER — PREDNISONE 10 MG PO TABS
ORAL_TABLET | ORAL | 0 refills | Status: DC
  Filled 2023-06-10: qty 21, 6d supply, fill #0

## 2023-06-10 MED ORDER — TRIAMTERENE-HCTZ 37.5-25 MG PO CAPS
1.0000 | ORAL_CAPSULE | Freq: Every morning | ORAL | 0 refills | Status: DC
  Filled 2023-06-10: qty 30, 30d supply, fill #0

## 2023-06-30 ENCOUNTER — Encounter: Payer: Self-pay | Admitting: Gastroenterology

## 2023-07-06 ENCOUNTER — Encounter: Payer: Self-pay | Admitting: Gastroenterology

## 2023-07-12 ENCOUNTER — Other Ambulatory Visit (HOSPITAL_BASED_OUTPATIENT_CLINIC_OR_DEPARTMENT_OTHER): Payer: Self-pay

## 2023-07-13 ENCOUNTER — Other Ambulatory Visit (HOSPITAL_BASED_OUTPATIENT_CLINIC_OR_DEPARTMENT_OTHER): Payer: Self-pay

## 2023-07-14 ENCOUNTER — Other Ambulatory Visit (HOSPITAL_BASED_OUTPATIENT_CLINIC_OR_DEPARTMENT_OTHER): Payer: Self-pay

## 2023-07-15 ENCOUNTER — Telehealth: Payer: Self-pay | Admitting: Internal Medicine

## 2023-07-15 NOTE — Telephone Encounter (Signed)
Pt's spouse came in office wanting to know if ok to est spouse as new pt with Dr Drue Novel (spouse parents use to see Dr Drue Novel). Please advise. Pt tel [phone].

## 2023-07-16 NOTE — Telephone Encounter (Signed)
That is ok

## 2023-07-20 NOTE — Telephone Encounter (Signed)
Pt scheduled on 09-27-2023 as new pt. Done

## 2023-08-10 ENCOUNTER — Other Ambulatory Visit (HOSPITAL_BASED_OUTPATIENT_CLINIC_OR_DEPARTMENT_OTHER): Payer: Self-pay

## 2023-08-10 ENCOUNTER — Ambulatory Visit (AMBULATORY_SURGERY_CENTER): Payer: Self-pay | Admitting: *Deleted

## 2023-08-10 VITALS — Ht 68.0 in | Wt 150.0 lb

## 2023-08-10 DIAGNOSIS — Z1211 Encounter for screening for malignant neoplasm of colon: Secondary | ICD-10-CM

## 2023-08-10 MED ORDER — NA SULFATE-K SULFATE-MG SULF 17.5-3.13-1.6 GM/177ML PO SOLN
1.0000 | Freq: Once | ORAL | 0 refills | Status: AC
  Filled 2023-08-10: qty 354, 1d supply, fill #0

## 2023-08-10 NOTE — Progress Notes (Signed)
Pt's name and DOB verified at the beginning of the pre-visit wit 2 identifiers  Pt denies any difficulty with ambulating,sitting, laying down or rolling side to side  Pt has no issues with ambulation   Pt has no issues moving head neck or swallowing  No egg or soy allergy known to patient   No issues known to pt with past sedation with any surgeries or procedures  Patient denies ever being intubated  No FH of Malignant Hyperthermia  Pt is not on diet pills or shots  Pt is not on home 02   Pt is not on blood thinners   Pt denies issues with constipation   Pt is not on dialysis  Pt denise any abnormal heart rhythms   Pt denies any upcoming cardiac testing  Pt encouraged to use to use Singlecare or Goodrx to reduce cost   Patient's chart reviewed by Cathlyn Parsons CNRA prior to pre-visit and patient appropriate for the [facility].  Pre-visit completed and red dot placed by patient's name on their procedure day (on provider's schedule).  .  Visit by phone  Pt states weight is   Instructed pt why it is important to and  to call if they have any changes in health or new medications. Directed them to the # given and on instructions.     Instructions reviewed. Pt given both [facility] main # and MD on call # prior to instructions.  Pt states understanding. Instructed to review again prior to procedure. Pt states they will.   Instructions sent by mail with coupon and by My Chart    Coupon sent via text to mobile phone and pt verified they received it

## 2023-08-26 ENCOUNTER — Encounter: Payer: Self-pay | Admitting: Certified Registered Nurse Anesthetist

## 2023-08-27 ENCOUNTER — Encounter: Payer: Self-pay | Admitting: Gastroenterology

## 2023-08-30 ENCOUNTER — Telehealth: Payer: Self-pay | Admitting: Gastroenterology

## 2023-08-30 ENCOUNTER — Encounter: Payer: 59 | Admitting: Gastroenterology

## 2023-08-30 NOTE — Telephone Encounter (Signed)
 Okay I understand, thanks for letting me know

## 2023-08-30 NOTE — Telephone Encounter (Signed)
 Good morning Dr. Leigh,   I have received a call from this patient, requesting to cancel his procedure for today at 9:30 AM. Patient tested positive for Covid over the weekend. He does state he tried to call over the weekend but did not seek it necessary to contact the on call provider. Patient states he will call back to reschedule once he hs his wife's schedule.   Thank you.

## 2023-09-01 ENCOUNTER — Other Ambulatory Visit (HOSPITAL_BASED_OUTPATIENT_CLINIC_OR_DEPARTMENT_OTHER): Payer: Self-pay

## 2023-09-01 MED ORDER — FLULAVAL 0.5 ML IM SUSY
0.5000 mL | PREFILLED_SYRINGE | Freq: Once | INTRAMUSCULAR | 0 refills | Status: AC
  Filled 2023-09-01: qty 0.5, 1d supply, fill #0

## 2023-09-02 DIAGNOSIS — N401 Enlarged prostate with lower urinary tract symptoms: Secondary | ICD-10-CM | POA: Insufficient documentation

## 2023-09-10 DIAGNOSIS — C4492 Squamous cell carcinoma of skin, unspecified: Secondary | ICD-10-CM | POA: Insufficient documentation

## 2023-09-24 ENCOUNTER — Other Ambulatory Visit: Payer: Self-pay

## 2023-09-27 ENCOUNTER — Ambulatory Visit: Payer: 59 | Admitting: Internal Medicine

## 2023-09-27 ENCOUNTER — Encounter: Payer: Self-pay | Admitting: Internal Medicine

## 2023-09-27 VITALS — BP 122/68 | HR 67 | Temp 98.0°F | Resp 16 | Ht 68.0 in | Wt 160.2 lb

## 2023-09-27 DIAGNOSIS — Z09 Encounter for follow-up examination after completed treatment for conditions other than malignant neoplasm: Secondary | ICD-10-CM | POA: Insufficient documentation

## 2023-09-27 DIAGNOSIS — N401 Enlarged prostate with lower urinary tract symptoms: Secondary | ICD-10-CM

## 2023-09-27 DIAGNOSIS — E785 Hyperlipidemia, unspecified: Secondary | ICD-10-CM

## 2023-09-27 DIAGNOSIS — F419 Anxiety disorder, unspecified: Secondary | ICD-10-CM | POA: Diagnosis not present

## 2023-09-27 LAB — CBC WITH DIFFERENTIAL/PLATELET
Basophils Absolute: 0 10*3/uL (ref 0.0–0.1)
Basophils Relative: 0.8 % (ref 0.0–3.0)
Eosinophils Absolute: 0.1 10*3/uL (ref 0.0–0.7)
Eosinophils Relative: 1.2 % (ref 0.0–5.0)
HCT: 43 % (ref 39.0–52.0)
Hemoglobin: 14.3 g/dL (ref 13.0–17.0)
Lymphocytes Relative: 22.8 % (ref 12.0–46.0)
Lymphs Abs: 1 10*3/uL (ref 0.7–4.0)
MCHC: 33.2 g/dL (ref 30.0–36.0)
MCV: 95.9 fL (ref 78.0–100.0)
Monocytes Absolute: 0.4 10*3/uL (ref 0.1–1.0)
Monocytes Relative: 8 % (ref 3.0–12.0)
Neutro Abs: 3 10*3/uL (ref 1.4–7.7)
Neutrophils Relative %: 67.2 % (ref 43.0–77.0)
Platelets: 192 10*3/uL (ref 150.0–400.0)
RBC: 4.49 Mil/uL (ref 4.22–5.81)
RDW: 13.3 % (ref 11.5–15.5)
WBC: 4.5 10*3/uL (ref 4.0–10.5)

## 2023-09-27 LAB — COMPREHENSIVE METABOLIC PANEL
ALT: 20 U/L (ref 0–53)
AST: 33 U/L (ref 0–37)
Albumin: 4.5 g/dL (ref 3.5–5.2)
Alkaline Phosphatase: 38 U/L — ABNORMAL LOW (ref 39–117)
BUN: 23 mg/dL (ref 6–23)
CO2: 26 meq/L (ref 19–32)
Calcium: 9.5 mg/dL (ref 8.4–10.5)
Chloride: 100 meq/L (ref 96–112)
Creatinine, Ser: 0.96 mg/dL (ref 0.40–1.50)
GFR: 83.71 mL/min (ref >60.00)
Glucose, Bld: 102 mg/dL — ABNORMAL HIGH (ref 70–99)
Potassium: 4.6 meq/L (ref 3.5–5.1)
Sodium: 136 meq/L (ref 135–145)
Total Bilirubin: 0.6 mg/dL (ref 0.2–1.2)
Total Protein: 7.5 g/dL (ref 6.0–8.3)

## 2023-09-27 LAB — LIPID PANEL
Cholesterol: 161 mg/dL (ref 0–200)
HDL: 77.5 mg/dL (ref >39.00)
LDL Cholesterol: 72 mg/dL (ref 0–99)
NonHDL: 83.5
Total CHOL/HDL Ratio: 2
Triglycerides: 58 mg/dL (ref 0.0–149.0)
VLDL: 11.6 mg/dL (ref 0.0–40.0)

## 2023-09-27 LAB — PSA: PSA: 0.61 ng/mL (ref 0.10–4.00)

## 2023-09-27 NOTE — Patient Instructions (Signed)
It was good to meet you today.  Recommend to consider COVID-vaccine booster  GO TO THE LAB : Get the blood work     Next visit with me in 6 months for a physical exam    Please schedule it at the front desk

## 2023-09-27 NOTE — Assessment & Plan Note (Signed)
New patient, referred by his wife.  I also took care of his mother many years ago. Hyperlipidemia: On atorvastatin for a while, check FLP and labs. BPH: Symptoms controlled, check a PSA Anxiety: On low-dose sertraline, wean off?  Is okay, he could decrease to 25 mg for few weeks then stop.  If needed he could go back on it. Preventive care: Tdap 09/2022, s/p Shingrix x 2, had a flu shot.  Recommend to consider COVID-vaccine. Colonoscopy 07/2013, plans to proceed in the next several months. RTC 6 months CPX

## 2023-09-27 NOTE — Progress Notes (Signed)
Subjective:    Patient ID: Ardeen Fillers, MD, male    DOB: August 12, 1961, 63 y.o.   MRN: [id number]  DOS:  09/27/2023 Type of visit - description: New patient, to get established. In general feels well. He retired recently, remains very active, goes to [employer], plays golf. Has no new symptoms or concerns.    Review of Systems See above   Past Medical History:  Diagnosis Date   Alopecia    Anxiety    BPH (benign prostatic hyperplasia)    Epididymoorchitis 11/2019   R   Hearing loss    Hyperlipemia    Meniere's disease    SCC (squamous cell carcinoma) 12/2017    Past Surgical History:  Procedure Laterality Date   EYE SURGERY  Lasik   HIP SURGERY Bilateral 1999   labrum tear repair, R microfracture treatment   KNEE ARTHROSCOPY Left 1997   w/ menisectomy   Social History   Socioeconomic History   Marital status: Married    Spouse name: Not on file   Number of children: 3   Years of education: Not on file   Highest education level: Not on file  Occupational History   Not on file  Tobacco Use   Smoking status: Never   Smokeless tobacco: Never  Substance and Sexual Activity   Alcohol use: Yes    Alcohol/week: 2.0 standard drinks of alcohol    Types: 2 Glasses of wine per week   Drug use: No   Sexual activity: Yes    Birth control/protection: None  Other Topics Concern   Not on file  Social History Narrative   Married, wife Sherwood, Charity fundraiser (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 : No  Physical Activity: Not on file  Stress: Not on file  Social Connections: Unknown (01/06/2022)   Received from [facility], [facility]   Social Network    Social Network: Not on file  Intimate Partner Violence: Unknown (11/28/2021)   [facility] from [facility], [facility]   HITS    Physically Hurt: Not on file    Insult or Talk Down To: Not on file    Threaten Physical Harm: Not on file    Scream or Curse: Not on file   Family History  Problem Relation Age of Onset   Anxiety disorder Mother    Parkinson's disease Father    Heart attack Father        late onset, late 53s   Anxiety disorder Father    Cancer Father    Heart disease Father    Hyperlipidemia Father    Colon cancer Neg Hx    Colon polyps Neg Hx    Esophageal cancer Neg Hx    Stomach cancer Neg Hx    Rectal cancer Neg Hx  Prostate cancer Neg Hx      Current Outpatient Medications  Medication Instructions   atorvastatin (LIPITOR) 20 mg, Oral, Daily   finasteride (PROPECIA) 1 mg, Oral, Daily   hydrocortisone cream 1 % 1 application , 4 times daily PRN   Omega-3 Fatty Acids (FISH OIL) 500 MG CAPS Take by mouth.   sertraline (ZOLOFT) 50 mg, Oral, Daily   tamsulosin (FLOMAX) 0.4 mg, Oral, 2 times daily   White Petrolatum-Mineral Oil (SYSTANE NIGHTTIME) OINT 1 Application, Both Eyes, Daily at bedtime       Objective:   Physical Exam BP 122/68   Pulse 67   Temp 98 F (36.7 C) (Oral)   Resp 16   Ht 5\' 8"  (1.727 m)   Wt 160 lb 4 oz (72.7 kg)   SpO2 98%   BMI 24.37 kg/m  General: Well developed, NAD, BMI noted Neck: No  thyromegaly  HEENT:  Normocephalic . Face symmetric, atraumatic Lungs:  CTA B Normal respiratory effort, no intercostal retractions, no accessory muscle use. Heart: RRR,  no murmur.  Abdomen:  Not distended, soft, non-tender. No rebound or rigidity.   Lower extremities: no  pretibial edema bilaterally  Skin: Exposed areas without rash. Not pale. Not jaundice Neurologic:  alert & oriented X3.  Speech normal, gait appropriate for age and unassisted Strength symmetric and appropriate for age.  Psych: Cognition and judgment appear intact.  Cooperative with normal attention span and concentration.  Behavior appropriate. No anxious or depressed appearing.     Assessment    Problem list (new patient 09-2023) Hyperlipidemia Anxiety  Hearing loss Mnire's disease BPH does not see urology regulalrly Alopecia  Low T at some point, pt elected observation   PLAN New patient, referred by his wife.  I also took care of his mother many years ago. Hyperlipidemia: On atorvastatin for a while, check FLP and labs. BPH: Symptoms controlled, check a PSA Anxiety: On low-dose sertraline, wean off?  Is okay, he could decrease to 25 mg for few weeks then stop.  If needed he could go back on it. Preventive care: Tdap 09/2022, s/p Shingrix x 2, had a flu shot.  Recommend to consider COVID-vaccine. Colonoscopy 07/2013, plans to proceed in the next several months. RTC 6 months CPX

## 2023-09-28 ENCOUNTER — Encounter: Payer: Self-pay | Admitting: Internal Medicine

## 2023-09-29 ENCOUNTER — Other Ambulatory Visit: Payer: Self-pay

## 2023-09-29 ENCOUNTER — Other Ambulatory Visit (HOSPITAL_BASED_OUTPATIENT_CLINIC_OR_DEPARTMENT_OTHER): Payer: Self-pay

## 2023-10-06 ENCOUNTER — Other Ambulatory Visit: Payer: Self-pay | Admitting: Internal Medicine

## 2023-10-06 ENCOUNTER — Other Ambulatory Visit (HOSPITAL_BASED_OUTPATIENT_CLINIC_OR_DEPARTMENT_OTHER): Payer: Self-pay

## 2023-10-07 ENCOUNTER — Other Ambulatory Visit (HOSPITAL_BASED_OUTPATIENT_CLINIC_OR_DEPARTMENT_OTHER): Payer: Self-pay

## 2023-10-07 ENCOUNTER — Other Ambulatory Visit: Payer: Self-pay

## 2023-10-07 MED ORDER — ATORVASTATIN CALCIUM 20 MG PO TABS
20.0000 mg | ORAL_TABLET | Freq: Every day | ORAL | 2 refills | Status: DC
  Filled 2023-10-07: qty 90, 90d supply, fill #0
  Filled 2023-12-28: qty 90, 90d supply, fill #1
  Filled 2024-03-27: qty 90, 90d supply, fill #2

## 2023-12-28 ENCOUNTER — Other Ambulatory Visit (HOSPITAL_BASED_OUTPATIENT_CLINIC_OR_DEPARTMENT_OTHER): Payer: Self-pay

## 2023-12-28 ENCOUNTER — Other Ambulatory Visit: Payer: Self-pay

## 2023-12-29 ENCOUNTER — Other Ambulatory Visit (HOSPITAL_BASED_OUTPATIENT_CLINIC_OR_DEPARTMENT_OTHER): Payer: Self-pay

## 2024-01-07 ENCOUNTER — Other Ambulatory Visit (HOSPITAL_BASED_OUTPATIENT_CLINIC_OR_DEPARTMENT_OTHER): Payer: Self-pay

## 2024-01-10 ENCOUNTER — Other Ambulatory Visit (HOSPITAL_BASED_OUTPATIENT_CLINIC_OR_DEPARTMENT_OTHER): Payer: Self-pay

## 2024-02-02 ENCOUNTER — Other Ambulatory Visit (HOSPITAL_BASED_OUTPATIENT_CLINIC_OR_DEPARTMENT_OTHER): Payer: Self-pay

## 2024-02-03 ENCOUNTER — Other Ambulatory Visit (HOSPITAL_BASED_OUTPATIENT_CLINIC_OR_DEPARTMENT_OTHER): Payer: Self-pay

## 2024-02-03 ENCOUNTER — Encounter (HOSPITAL_BASED_OUTPATIENT_CLINIC_OR_DEPARTMENT_OTHER): Payer: Self-pay

## 2024-02-03 MED ORDER — SERTRALINE HCL 50 MG PO TABS
50.0000 mg | ORAL_TABLET | Freq: Every day | ORAL | 3 refills | Status: AC
  Filled 2024-02-03: qty 90, 90d supply, fill #0
  Filled 2024-04-25: qty 90, 90d supply, fill #1
  Filled 2024-07-25: qty 90, 90d supply, fill #2

## 2024-02-03 MED ORDER — TAMSULOSIN HCL 0.4 MG PO CAPS
0.4000 mg | ORAL_CAPSULE | Freq: Two times a day (BID) | ORAL | 3 refills | Status: AC
  Filled 2024-02-03: qty 180, 90d supply, fill #0
  Filled 2024-05-23: qty 180, 90d supply, fill #1

## 2024-02-28 ENCOUNTER — Encounter: Payer: Self-pay | Admitting: Internal Medicine

## 2024-02-28 ENCOUNTER — Ambulatory Visit (INDEPENDENT_AMBULATORY_CARE_PROVIDER_SITE_OTHER): Admitting: Internal Medicine

## 2024-02-28 VITALS — BP 108/60 | HR 65 | Temp 98.0°F | Resp 16 | Ht 68.0 in | Wt 148.5 lb

## 2024-02-28 DIAGNOSIS — Z8249 Family history of ischemic heart disease and other diseases of the circulatory system: Secondary | ICD-10-CM

## 2024-02-28 DIAGNOSIS — Z114 Encounter for screening for human immunodeficiency virus [HIV]: Secondary | ICD-10-CM

## 2024-02-28 DIAGNOSIS — R7989 Other specified abnormal findings of blood chemistry: Secondary | ICD-10-CM

## 2024-02-28 DIAGNOSIS — G3184 Mild cognitive impairment, so stated: Secondary | ICD-10-CM

## 2024-02-28 DIAGNOSIS — Z0000 Encounter for general adult medical examination without abnormal findings: Secondary | ICD-10-CM | POA: Diagnosis not present

## 2024-02-28 DIAGNOSIS — Z1159 Encounter for screening for other viral diseases: Secondary | ICD-10-CM

## 2024-02-28 DIAGNOSIS — E785 Hyperlipidemia, unspecified: Secondary | ICD-10-CM | POA: Diagnosis not present

## 2024-02-28 DIAGNOSIS — R739 Hyperglycemia, unspecified: Secondary | ICD-10-CM | POA: Diagnosis not present

## 2024-02-28 NOTE — Assessment & Plan Note (Signed)
 Here for a physical exam Other issues: Hyperlipidemia: On atorvastatin , last LDL 72.  No change Anxiety: See HPI, stopped sertraline  in March, decided to restart it 3 weeks ago because he was feeling slightly anxious.  Denies depression. MCI ?: Has felt some cognitive decline per patient, wife has not noticed a difference.  Examples are-- difficulty finding words, easily distracted.   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  Several options discussed at the end we elected: Blood work, refer to neurology,  reassess in 6 months. History of low testosterone : Check a testosterone  level. Family history of RJI:[id number], has a excellent lifestyle, EKG today sinus bradycardia, no acute changes.  Similar to previous EKG RTC 6 months

## 2024-02-28 NOTE — Assessment & Plan Note (Signed)
 Here for a physical exam - Tdap 09/2022 - s/p Shingrix x 2, had a flu shot. - Vaccines are recommended: Flu shot , covid vax  -CCS colonoscopy 07/2013, plans to proceed soon. - Prostate cancer screening: History of BPH, controlled on Flomax , recent PSA normal, no symptoms. - Labs: Vitamin D  BMP  A1c   HIV hep C , dementia panel,  total testosterone 

## 2024-02-28 NOTE — Progress Notes (Signed)
 Subjective:    Patient ID: Benjamin MARLA Letha DOUGLAS, MD, male    DOB: 04-13-1961, 63 y.o.   MRN: [id number]  DOS:  02/28/2024 Type of visit - description: CPX  Here for CPX He is somewhat concerned about his cognitive state. Has noted himself to be slightly forgetful, some difficulty finding words, distracted at times. This happened in the context of: - The patient stopped sertraline  10/2023 and going back on it 3 weeks ago because he felt slightly anxious (no depression). 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   sertraline  (ZOLOFT ) 50 mg, Oral, Daily   tamsulosin  (FLOMAX ) 0.4 mg, Oral, 2 times daily       Objective:   Physical Exam BP 108/60   Pulse 65   Temp 98 F (36.7 C) (Oral)   Resp 16   Ht 5' 8 (1.727 m)   Wt 148 lb 8 oz (67.4 kg)   SpO2 95%   BMI 22.58 kg/m  General: Well developed, NAD, BMI noted Neck: No  thyromegaly  HEENT:  Normocephalic . Face symmetric, atraumatic Lungs:   CTA B Normal respiratory effort, no intercostal retractions, no accessory muscle use. Heart: RRR,  no murmur.  Abdomen:  Not distended, soft, non-tender. No rebound or rigidity.   Lower extremities: no pretibial edema bilaterally  Skin: Exposed areas without rash. Not pale. Not jaundice Neurologic:  alert & oriented X3.  Speech normal, gait appropriate for age and unassisted Strength symmetric and appropriate for age.  Psych: Cognition and judgment appear intact.  Cooperative with normal attention span and concentration.  Behavior appropriate. No anxious or depressed appearing.     Assessment   Problem list (new patient 09-2023) Hyperlipidemia Anxiety  Hearing loss Mnire's disease BPH does not see urology regulalrly Alopecia  Low T at some point, pt elected observation   PLAN Here for a physical exam - Tdap 09/2022 - s/p Shingrix x 2, had a flu shot. - Vaccines are recommended: Flu shot , covid vax  -[facility] colonoscopy 07/2013, plans to proceed soon. - Prostate cancer screening: History of BPH, controlled on Flomax , recent PSA normal, no symptoms. - Labs: Vitamin D  BMP  A1c   HIV hep C , dementia panel,  total testosterone  Other issues: Hyperlipidemia: On atorvastatin , last LDL 72.  No change  Anxiety: See HPI, stopped sertraline  in March, decided to restart it 3 weeks ago because he was feeling slightly anxious.  Denies depression. MCI ?: Has felt some cognitive decline per patient, wife has not noticed a difference.  Examples are-- difficulty finding words, easily distracted.   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  Several options discussed at the end we elected: Blood work, refer to neurology,  reassess in 6 months. History of low testosterone : Check a testosterone  level. Family history of RJI:Jdbfeunfjupr, has a excellent lifestyle, EKG  today sinus bradycardia, no acute changes.  Similar to previous EKG RTC 6 months

## 2024-02-28 NOTE — Patient Instructions (Signed)
 Please make an appointment for the following: Lab appointment early in the morning, fasting. Routine visit in 6 months   Vaccines I recommend: Flu shot every fall COVID booster is an option Reach out to gastroenterology when you are ready for colonoscopy: [phone]   STOP BY THE FIRST FLOOR:  get the XR

## 2024-02-29 ENCOUNTER — Other Ambulatory Visit (INDEPENDENT_AMBULATORY_CARE_PROVIDER_SITE_OTHER)

## 2024-02-29 DIAGNOSIS — E785 Hyperlipidemia, unspecified: Secondary | ICD-10-CM

## 2024-02-29 DIAGNOSIS — R739 Hyperglycemia, unspecified: Secondary | ICD-10-CM | POA: Diagnosis not present

## 2024-02-29 DIAGNOSIS — Z114 Encounter for screening for human immunodeficiency virus [HIV]: Secondary | ICD-10-CM

## 2024-02-29 DIAGNOSIS — G3184 Mild cognitive impairment, so stated: Secondary | ICD-10-CM | POA: Diagnosis not present

## 2024-02-29 DIAGNOSIS — R7989 Other specified abnormal findings of blood chemistry: Secondary | ICD-10-CM | POA: Diagnosis not present

## 2024-02-29 DIAGNOSIS — Z1159 Encounter for screening for other viral diseases: Secondary | ICD-10-CM

## 2024-02-29 LAB — CBC WITH DIFFERENTIAL/PLATELET
Basophils Absolute: 0 K/uL (ref 0.0–0.1)
Basophils Relative: 0.6 % (ref 0.0–3.0)
Eosinophils Absolute: 0.2 K/uL (ref 0.0–0.7)
Eosinophils Relative: 4.3 % (ref 0.0–5.0)
HCT: 43.1 % (ref 39.0–52.0)
Hemoglobin: 14.2 g/dL (ref 13.0–17.0)
Lymphocytes Relative: 34.3 % (ref 12.0–46.0)
Lymphs Abs: 1.5 K/uL (ref 0.7–4.0)
MCHC: 33 g/dL (ref 30.0–36.0)
MCV: 95.6 fl (ref 78.0–100.0)
Monocytes Absolute: 0.5 K/uL (ref 0.1–1.0)
Monocytes Relative: 12.6 % — ABNORMAL HIGH (ref 3.0–12.0)
Neutro Abs: 2.1 K/uL (ref 1.4–7.7)
Neutrophils Relative %: 48.2 % (ref 43.0–77.0)
Platelets: 168 K/uL (ref 150.0–400.0)
RBC: 4.51 Mil/uL (ref 4.22–5.81)
RDW: 13.5 % (ref 11.5–15.5)
WBC: 4.3 K/uL (ref 4.0–10.5)

## 2024-02-29 LAB — BASIC METABOLIC PANEL WITH GFR
BUN: 17 mg/dL (ref 6–23)
CO2: 30 meq/L (ref 19–32)
Calcium: 9.9 mg/dL (ref 8.4–10.5)
Chloride: 101 meq/L (ref 96–112)
Creatinine, Ser: 0.88 mg/dL (ref 0.40–1.50)
GFR: 90.8 mL/min (ref >60.00)
Glucose, Bld: 77 mg/dL (ref 70–99)
Potassium: 4.4 meq/L (ref 3.5–5.1)
Sodium: 140 meq/L (ref 135–145)

## 2024-02-29 LAB — TESTOSTERONE: Testosterone: 403.27 ng/dL (ref 300.00–890.00)

## 2024-02-29 LAB — B12 AND FOLATE PANEL
Folate: 17.6 ng/mL (ref >5.9)
Vitamin B-12: 541 pg/mL (ref 211–911)

## 2024-02-29 LAB — SEDIMENTATION RATE: Sed Rate: 6 mm/h (ref 0–20)

## 2024-02-29 LAB — VITAMIN D 25 HYDROXY (VIT D DEFICIENCY, FRACTURES): VITD: 43.34 ng/mL (ref 30.00–100.00)

## 2024-02-29 LAB — TSH: TSH: 2.28 u[IU]/mL (ref 0.35–5.50)

## 2024-02-29 LAB — HEMOGLOBIN A1C: Hgb A1c MFr Bld: 5.9 % (ref 4.6–6.5)

## 2024-03-01 LAB — HIV ANTIBODY (ROUTINE TESTING W REFLEX): HIV 1&2 Ab, 4th Generation: NONREACTIVE

## 2024-03-01 LAB — RPR: RPR Ser Ql: NONREACTIVE

## 2024-03-01 LAB — HEPATITIS C ANTIBODY: Hepatitis C Ab: NONREACTIVE

## 2024-03-02 ENCOUNTER — Ambulatory Visit: Payer: Self-pay | Admitting: Internal Medicine

## 2024-03-07 ENCOUNTER — Encounter: Admitting: Internal Medicine

## 2024-03-08 ENCOUNTER — Ambulatory Visit: Payer: Self-pay | Admitting: Neurology

## 2024-03-13 ENCOUNTER — Encounter: Payer: Self-pay | Admitting: Neurology

## 2024-03-13 ENCOUNTER — Ambulatory Visit (INDEPENDENT_AMBULATORY_CARE_PROVIDER_SITE_OTHER): Payer: Self-pay | Admitting: Neurology

## 2024-03-13 VITALS — BP 127/73 | HR 76 | Ht 68.0 in | Wt 152.2 lb

## 2024-03-13 DIAGNOSIS — G3184 Mild cognitive impairment, so stated: Secondary | ICD-10-CM | POA: Diagnosis not present

## 2024-03-13 DIAGNOSIS — R400 Somnolence: Secondary | ICD-10-CM | POA: Diagnosis not present

## 2024-03-13 DIAGNOSIS — Z818 Family history of other mental and behavioral disorders: Secondary | ICD-10-CM

## 2024-03-13 DIAGNOSIS — G4761 Periodic limb movement disorder: Secondary | ICD-10-CM | POA: Diagnosis not present

## 2024-03-13 DIAGNOSIS — R413 Other amnesia: Secondary | ICD-10-CM

## 2024-03-13 DIAGNOSIS — E519 Thiamine deficiency, unspecified: Secondary | ICD-10-CM | POA: Diagnosis not present

## 2024-03-13 DIAGNOSIS — G4752 REM sleep behavior disorder: Secondary | ICD-10-CM

## 2024-03-13 DIAGNOSIS — G479 Sleep disorder, unspecified: Secondary | ICD-10-CM

## 2024-03-13 NOTE — Progress Notes (Unsigned)
 [facility]    Ellis:  Dr [name]: [name], [name] Primary Care Ellis:  Benjamin Aloysius Ellis, [name]  CC:  Perceived memory lodd  HPI:  Benjamin [name] [name], [name] is a 63 y.o. male here as requested by [name], [name] for Memory loss. 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 He retired about a year ago and is worried about his memory. He is trying to figure out if this is normal cognitive aging. He retired about a year ago. His family doesn;t notice anything wrong but he is concerned because of several reasons including his mom died of lewy body dementia, she was diagnosed with lewy body dementia. It was painful to watch his mother suffer with a neurodegenerative disease.[name] He has noticed mild things, primarily in a social situation, forgeting some familiar names, not following conversations, being distractable, being forgetful, a little 'off however he is still highly functional, around May 1st he had covid and he a mild case and was tested because his wife is a Programmer, applications, had typical covid symptoms including fatigue but post COVID he was in a deep fog, not functioning, would forget why he was in a room.  He does not know know if it is post covid on top of an aging brain.  With COVID he believes he developed motor neuropathy because within a day he developed fasciculations, symmetrical below te knees, no where else, with weakness, no low back pain however weakness and fasciculations have improved. Sleep is also an issue, he has a long-standing history of sleep problems, he snores, he gets sleepy during the day, he routinely takes naps which became worse with covid. No hx of acohol abuse. More short  term, may be adjusting to retirement.  Patient reports he still has hobbies, still very social, no significant mood disorders some mild anxiety, no hallucinations or delusions, Father had alzheimer's but died of parkinson's disease he was 23. Mother was 50 when she died with Lewy body dementia. Brother and sister do not have any memory concerns, one is 10 years youner one is 3 years older and both doing fine. He had a few embarrassing moments socially so he is more on guard and possibly a little bit socially withdrawn because he doesn't feel he is on his game .  He has had REM sleep disorder episodes, for example he had a very vivid dream that he was in the [place] war in a foxhole and a grenade was thrown into the foxhole and he jumped out of the bed, however episodes like this are rare. PLMS as well, periodic limb movements of sleep, also may be interrupting his sleep cycles.No other focal neurologic deficits, associated symptoms, inciting events or modifiable factors.  Reviewed notes, labs and imaging from outside physicians, which showed:  July 8 TSH normal, BMP normal, B12 and folate normal, hemoglobin A1c 5.9 normal, HIV and RPR nonreactive.  Recent Results (from the past 2160 hours)  CBC w/Diff     Status: Abnormal   Collection Time: 02/29/24  7:54 AM  Result Value Ref Range   WBC 4.3 4.0 - 10.5 K/uL   RBC 4.51 4.22 - 5.81 Mil/uL   Hemoglobin 14.2 13.0 - 17.0 g/dL   HCT 56.8 60.9 - 47.9 %  MCV 95.6 78.0 - 100.0 fl   MCHC 33.0 30.0 - 36.0 g/dL   RDW 86.4 88.4 - 84.4 %   Platelets 168.0 150.0 - 400.0 K/uL   Neutrophils Relative % 48.2 43.0 - 77.0 %   Lymphocytes Relative 34.3 12.0 - 46.0 %   Monocytes Relative 12.6 (H) 3.0 - 12.0 %   Eosinophils Relative 4.3 0.0 - 5.0 %   Basophils Relative 0.6 0.0 - 3.0 %   Neutro Abs 2.1 1.4 - 7.7 K/uL   Lymphs Abs 1.5 0.7 - 4.0 K/uL   Monocytes Absolute 0.5 0.1 - 1.0 K/uL   Eosinophils Absolute 0.2 0.0 - 0.7 K/uL   Basophils Absolute 0.0 0.0 -  0.1 K/uL  Testosterone      Status: None   Collection Time: 02/29/24  7:54 AM  Result Value Ref Range   Testosterone  403.27 300.00 - 890.00 ng/dL  Vitamin D  (25 hydroxy)     Status: None   Collection Time: 02/29/24  7:54 AM  Result Value Ref Range   VITD 43.34 30.00 - 100.00 ng/mL  Hepatitis C antibody     Status: None   Collection Time: 02/29/24  7:54 AM  Result Value Ref Range   Hepatitis C Ab NON-REACTIVE NON-REACTIVE    Comment: . HCV antibody was non-reactive. There is no laboratory  evidence of HCV infection. . In most cases, no further action is required. However, if recent HCV exposure is suspected, a test for HCV RNA (test code 64354) is suggested. . For additional information please refer to http://education.[facility].com/faq/FAQ22v1 (This link is being provided for informational/ educational purposes only.) .   HIV antibody (with reflex)     Status: None   Collection Time: 02/29/24  7:54 AM  Result Value Ref Range   HIV FINAL INTERPRETATION      Comment: HIV Negative . HIV-1 antigen and HIV-1/HIV-2 antibodies were not detected. There is no laboratory evidence of HIV infection.    HIV 1&2 Ab, 4th Generation NON-REACTIVE NON-REACTIVE  HgB A1c     Status: None   Collection Time: 02/29/24  7:54 AM  Result Value Ref Range   Hgb A1c MFr Bld 5.9 4.6 - 6.5 %    Comment: Glycemic Control Guidelines for People with Diabetes:Non Diabetic:  <6%Goal of Therapy: <7%Additional Action Suggested:  >8%   B12 and Folate Panel     Status: None   Collection Time: 02/29/24  7:54 AM  Result Value Ref Range   Vitamin B-12 541 211 - 911 pg/mL   Folate 17.6 >5.9 ng/mL  Basic metabolic panel with GFR     Status: None   Collection Time: 02/29/24  7:54 AM  Result Value Ref Range   Sodium 140 135 - 145 mEq/L   Potassium 4.4 3.5 - 5.1 mEq/L   Chloride 101 96 - 112 mEq/L   CO2 30 19 - 32 mEq/L   Glucose, Bld 77 70 - 99 mg/dL   BUN 17 6 - 23 mg/dL   Creatinine, Ser 9.11 0.40  - 1.50 mg/dL   GFR 09.19 >39.99 mL/min    Comment: Calculated using the CKD-EPI Creatinine Equation (2021)   Calcium  9.9 8.4 - 10.5 mg/dL  TSH     Status: None   Collection Time: 02/29/24  7:54 AM  Result Value Ref Range   TSH 2.28 0.35 - 5.50 uIU/mL  Sedimentation rate     Status: None   Collection Time: 02/29/24  7:54 AM  Result Value Ref Range   Sed Rate  6 0 - 20 mm/hr  RPR     Status: None   Collection Time: 02/29/24  7:54 AM  Result Value Ref Range   RPR Ser Ql NON-REACTIVE NON-REACTIVE    Comment: . No laboratory evidence of syphilis. If recent exposure is suspected, submit a new sample in 2-4 weeks. .   ATN PROFILE     Status: None (Preliminary result)   Collection Time: 03/13/24  4:21 PM  Result Value Ref Range   A -- Beta-amyloid 42/40 Ratio WILL FOLLOW    Beta-amyloid 42 WILL FOLLOW    Beta-amyloid 40 WILL FOLLOW    T -- [id number] WILL FOLLOW    N -- NfL, Plasma WILL FOLLOW    ATN SUMMARY WILL FOLLOW    Information: WILL FOLLOW   APOE Alzheimer's Risk     Status: None (Preliminary result)   Collection Time: 03/13/24  4:21 PM  Result Value Ref Range   Methodology: WILL FOLLOW    APO E Genotyping Result: WILL FOLLOW    Interpretation: WILL FOLLOW    Comment: WILL FOLLOW   Vitamin B1     Status: None (Preliminary result)   Collection Time: 03/13/24  4:21 PM  Result Value Ref Range   Thiamine WILL FOLLOW      Review of Systems: Patient complains of symptoms per HPI as well as the following symptoms per hpi. Pertinent negatives and positives per HPI. All others negative.   Social History   Socioeconomic History   Marital status: Married    Spouse name: Not on file   Number of children: 3   Years of education: Not on file   Highest education level: Not on file  Occupational History   Not on file  Tobacco Use   Smoking status: Never   Smokeless tobacco: Never  Vaping Use   Vaping status: Not on file  Substance and Sexual Activity   Alcohol use: Yes     Alcohol/week: 3.0 standard drinks of alcohol    Types: 3 Glasses of wine per week   Drug use: No   Sexual activity: Yes    Birth control/protection: None  Other Topics Concern   Not on file  Social History Narrative   Married, 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 : Never true  Transportation Needs: No Transportation Needs (06/10/2023)   Received from [facility]    In the past 12 months, has lack of reliable transportation kept you from medical appointments, meetings, work or from getting things needed for daily living? : No  Physical Activity: Not on file  Stress: Not on file  Social Connections: Unknown (01/06/2022)   [facility] from [facility]   Social Network    Social Network: Not on file  Intimate Partner Violence: Unknown (11/28/2021)   Received from [facility]   HITS    Physically Hurt: Not on file    Insult or Talk Down  To: Not on file    Threaten Physical Harm: Not on file    Scream or Curse: Not on file    Family History  Problem Relation Age of Onset   Anxiety disorder Mother    Dementia Mother    Parkinson's disease Father    Heart attack Father        late onset, late 54s   Anxiety disorder Father    Cancer Father    Heart disease Father    Hyperlipidemia Father    Colon cancer Neg Hx    Colon polyps Neg Hx    Esophageal cancer Neg Hx    Stomach cancer Neg Hx    Rectal cancer Neg Hx    Prostate cancer Neg Hx     Past Medical History:  Diagnosis Date   Alopecia    Anxiety    BPH (benign  prostatic hyperplasia)    Epididymoorchitis 11/2019   R   Hearing loss    Hyperlipemia    Meniere's disease    SCC (squamous cell carcinoma) 12/2017    Patient Active Problem List   Diagnosis Date Noted   Annual physical exam 02/28/2024   PCP NOTES >>>>>> 09/27/2023   SCC (squamous cell carcinoma) 09/10/2023   Benign prostatic hyperplasia with lower urinary tract symptoms 09/02/2023   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 90 tablet 2   finasteride  (PROPECIA ) 1 MG tablet Take 1 tablet (1 mg total) by mouth daily. 90 tablet 4   Omega-3 Fatty Acids (FISH OIL) 500 MG CAPS Take by mouth.     sertraline  (ZOLOFT ) 50 MG tablet Take 1 tablet (50 mg total) by mouth daily. 90 tablet 3   tamsulosin  (FLOMAX ) 0.4 MG CAPS capsule Take 1 capsule (0.4 mg total) by mouth 2 (two) times daily. 180 capsule 3   No current facility-administered medications for this visit.    Allergies as of 03/13/2024   (No Known Allergies)    Vitals: BP 127/73   Pulse 76   Ht 5' 8 (1.727 m)   Wt 152 lb 3.2 oz (69 kg)   BMI 23.14 kg/m  Last Weight:  Wt Readings from Last 1 Encounters:  03/13/24 152 lb 3.2 oz (69 kg)   Last Height:   Ht Readings from Last 1 Encounters:  03/13/24 5' 8 (1.727 m)     Physical exam: Exam: Gen: NAD, conversant, well nourised, well groomed                     CV: RRR, no MRG. No Carotid Bruits. No peripheral edema, warm, nontender Eyes:  Conjunctivae clear without exudates or hemorrhage  Neuro: Detailed Neurologic Exam  Speech:    Speech is normal; fluent and spontaneous with normal comprehension.  Cognition:    The patient is oriented to person, place, and time;     recent and remote memory intact;     language fluent;  normal attention, concentration,     fund of knowledge Cranial Nerves:    The pupils are equal, round, and reactive to light. The fundi are normal and spontaneous venous pulsations are present. Visual fields are full to finger confrontation. Extraocular movements are intact. Trigeminal sensation is intact and the muscles of mastication are normal. The face is symmetric. The palate elevates in the midline. Hearing intact. Voice is normal. Shoulder shrug is normal. The tongue has normal motion without fasciculations.   Coordination: Normal   Gait: Normal   Motor Observation:    No asymmetry, no atrophy, and no involuntary movements noted. Tone:    Normal muscle tone.    Posture:    Posture is normal. normal erect    Strength:    Strength is V/V in the upper and lower limbs.      Sensation: intact to LT     Reflex Exam:  DTR's:    Deep tendon reflexes in the upper and lower extremities are normal bilaterally.   Toes:    The toes are downgoing bilaterally.   Clonus:    Clonus is absent.    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 He retired about a year ago and is worried about his memory. He is trying to figure out if this is normal cognitive aging.  He has a family history of Lewy body dementia in his mother and  Alzheimer's in his father.  He has noticed more forgetfulness, mild things primarily in a social situation, forgetting some familiar names and following conversations being a little bit more forgetful or distractible complicated by having a mild case of COVID in May and afterwards feeling like he was in a deep fog.    I suspect this may be normal cognitive aging combined with COVID infection in May that left him feeling a little like he was in a deep fog as well as  recent retirement with  decrease in mental stimulation and engagement.  However given patient's subjective complaints, and his family history of dementia in both parents, cannot rule out mild cognitive impairment amnestic type or possibly prodromal Alzheimer's.  MRI of the brain w/wo contrast for reversible causes of dementia Alzeimer's biomarkers (Amyloid, APO E4 testing), vitamin B1 (B12, RPR, TSH already ordered and normal) CT amyloid PET will be considered after at the above pending results and discussion with patient Formal neurocognitive testing  Sleep evaluation: Dr. Chalice: Patient has difficulty sleeping with nonrefreshing sleep, he has a long-standing history of sleep problems, he snores, he gets sleepy during the day, he routinely takes naps which became worse with covid.memory concerns, he has a family history of Parkinson's disease in his father, he has periodic limb movements of sleep that may be keeping him up and he is also had some episodes concerning for REM sleep disorder, sleep evaluation with Dr. Chalice.     Orders Placed This Encounter  Procedures   MR BRAIN W WO CONTRAST   ATN PROFILE   APOE Alzheimer's Risk   Vitamin B1   Ambulatory referral to Neuropsychology   Ambulatory referral to Sleep Studies     Cc: [name], [name],  [name], [name]  Onetha Epp, [name]  [facility] [address] [place], [place] [address]  Phone [phone] Fax [phone]  I spent 65  minutes of face-to-face and non-face-to-face time with patient on the  1. MCI (mild cognitive impairment)   2. screen for Vitamin B1 deficiency   3. PLMD (periodic limb movement disorder)   4. Sleep disorder   5. REM sleep behavior disorder   6. Daytime somnolence   7. Short-term memory loss   8. FHx: dementia    diagnosis.  This included previsit chart review, lab review, study review, order entry, electronic health record documentation, patient education on the different diagnostic and therapeutic options, counseling and coordination of care, risks and benefits of management, compliance, or risk factor reduction

## 2024-03-13 NOTE — Patient Instructions (Addendum)
 MRI of the brain w/wo contrast Alzeimer's biomarkers (Amyloid, APO E4 testing), vitamin B1 CT amyloid PET Formal neurocognitive testing  Sleep evaluation: Dr. Chalice

## 2024-03-14 ENCOUNTER — Encounter: Payer: Self-pay | Admitting: Neurology

## 2024-03-15 ENCOUNTER — Telehealth: Payer: Self-pay | Admitting: Neurology

## 2024-03-15 NOTE — Telephone Encounter (Signed)
 Referral for neuropsychology fax to [facility]. Phone: [phone], Fax; [phone].

## 2024-03-16 ENCOUNTER — Telehealth: Payer: Self-pay | Admitting: Neurology

## 2024-03-16 NOTE — Telephone Encounter (Signed)
 Patient asking to speak with a nurse to discuss what test was ordered and was covered by insurance. Want to know what will be expected out of pocket. Blood work, neuropsychology ordered. Spoke with [facility] Billing and was advised to speak Dr. Sharion nurse. Would like a call back

## 2024-03-16 NOTE — Telephone Encounter (Signed)
 I called pt and LMVM and relayed that MRI ordered.  If that test he is questioning, can check with coordinator MRI Or the img site. Will f/u tomorrow.

## 2024-03-17 NOTE — Telephone Encounter (Signed)
 I called pt and LMVM for him that was returning call again.  Will not be here now till Monday.  If he still needs assistance will not be until Monday.

## 2024-03-18 ENCOUNTER — Ambulatory Visit: Payer: Self-pay | Admitting: Neurology

## 2024-03-20 ENCOUNTER — Telehealth: Payer: Self-pay | Admitting: Neurology

## 2024-03-20 NOTE — Telephone Encounter (Signed)
 Pt called , returning call, informed that  Pt will get call back .

## 2024-03-20 NOTE — Telephone Encounter (Signed)
 Pt has returned call to RN

## 2024-03-20 NOTE — Telephone Encounter (Signed)
 I called and spoke to pt.  He states that he probably needed to speak to his insurance about his inquiries but wanted to ask, about MRI auth (if would know) OOP and pet scan , this not ordered probably due to results of labs pending, then sleep study (which I said sleep consult ordered (need to see sleep provider first) then would auth test if ordered.  He said he would also mychart Dr. Ines if needed as well.  He appreciated follow up.

## 2024-03-20 NOTE — Telephone Encounter (Signed)
 I called patient to discuss answer, left a voicemail asking him to call us  back.  When he calls back, can we clarify which test he has a question regarding?  If it is MRI, the MRI coordinator may be the best one to address this.

## 2024-03-21 LAB — APOE ALZHEIMER'S RISK

## 2024-03-21 LAB — ATN PROFILE
A -- Beta-amyloid 42/40 Ratio: 0.103 (ref >0.102)
Beta-amyloid 40: 191.12 pg/mL
Beta-amyloid 42: 19.68 pg/mL
N -- NfL, Plasma: 1.82 pg/mL (ref 0.00–3.65)
T -- p-tau181: 0.69 pg/mL (ref 0.00–0.97)

## 2024-03-21 LAB — VITAMIN B1: Thiamine: 121.3 nmol/L (ref 66.5–200.0)

## 2024-03-27 ENCOUNTER — Other Ambulatory Visit (HOSPITAL_BASED_OUTPATIENT_CLINIC_OR_DEPARTMENT_OTHER): Payer: Self-pay

## 2024-03-28 ENCOUNTER — Encounter: Payer: 59 | Admitting: Internal Medicine

## 2024-04-26 ENCOUNTER — Other Ambulatory Visit: Payer: Self-pay

## 2024-05-30 NOTE — Telephone Encounter (Signed)
 Called and left voicemail for patient to reschedule appointment on 11/17 with Dr Ines.  If patient calls back, they can be rescheduled with Dr Margaret

## 2024-06-26 ENCOUNTER — Other Ambulatory Visit: Payer: Self-pay

## 2024-06-26 ENCOUNTER — Other Ambulatory Visit (HOSPITAL_BASED_OUTPATIENT_CLINIC_OR_DEPARTMENT_OTHER): Payer: Self-pay

## 2024-06-26 ENCOUNTER — Other Ambulatory Visit: Payer: Self-pay | Admitting: Internal Medicine

## 2024-06-26 MED ORDER — ATORVASTATIN CALCIUM 20 MG PO TABS
20.0000 mg | ORAL_TABLET | Freq: Every day | ORAL | 1 refills | Status: AC
  Filled 2024-06-26: qty 90, 90d supply, fill #0
  Filled 2024-09-25: qty 90, 90d supply, fill #1

## 2024-07-10 ENCOUNTER — Ambulatory Visit: Admitting: Neurology

## 2024-07-21 ENCOUNTER — Other Ambulatory Visit (HOSPITAL_BASED_OUTPATIENT_CLINIC_OR_DEPARTMENT_OTHER): Payer: Self-pay

## 2024-07-26 ENCOUNTER — Other Ambulatory Visit (HOSPITAL_BASED_OUTPATIENT_CLINIC_OR_DEPARTMENT_OTHER): Payer: Self-pay

## 2024-07-26 MED ORDER — FINASTERIDE 1 MG PO TABS
1.0000 mg | ORAL_TABLET | Freq: Every day | ORAL | 4 refills | Status: AC
  Filled 2024-07-26: qty 90, 90d supply, fill #0

## 2024-08-08 ENCOUNTER — Other Ambulatory Visit (HOSPITAL_BASED_OUTPATIENT_CLINIC_OR_DEPARTMENT_OTHER): Payer: Self-pay

## 2024-08-08 MED ORDER — FLUZONE HIGH-DOSE 0.5 ML IM SUSY
0.5000 mL | PREFILLED_SYRINGE | Freq: Once | INTRAMUSCULAR | 0 refills | Status: AC
  Filled 2024-08-08: qty 0.5, 1d supply, fill #0

## 2024-09-05 ENCOUNTER — Ambulatory Visit: Payer: Self-pay | Admitting: Internal Medicine

## 2024-09-05 ENCOUNTER — Ambulatory Visit: Admitting: Internal Medicine

## 2024-09-05 ENCOUNTER — Encounter: Payer: Self-pay | Admitting: Internal Medicine

## 2024-09-05 VITALS — BP 108/70 | HR 65 | Temp 98.1°F | Resp 16 | Ht 68.0 in | Wt 157.1 lb

## 2024-09-05 DIAGNOSIS — R739 Hyperglycemia, unspecified: Secondary | ICD-10-CM | POA: Diagnosis not present

## 2024-09-05 DIAGNOSIS — E785 Hyperlipidemia, unspecified: Secondary | ICD-10-CM | POA: Diagnosis not present

## 2024-09-05 DIAGNOSIS — Z23 Encounter for immunization: Secondary | ICD-10-CM | POA: Diagnosis not present

## 2024-09-05 LAB — HEMOGLOBIN A1C: Hgb A1c MFr Bld: 5.6 % (ref 4.6–6.5)

## 2024-09-05 NOTE — Progress Notes (Signed)
" ° °  Subjective:    Patient ID: Benjamin MARLA Letha DOUGLAS, MD, male    DOB: 1960/12/07, 64 y.o.   MRN: [id number]  DOS:  09/05/2024  Follow up Routine follow-up. Saw neurology.  Note reviewed. Overall feeling better   Review of Systems See above   Past Medical History:  Diagnosis Date   Alopecia    Anxiety    BPH (benign prostatic hyperplasia)    Epididymoorchitis 11/2019   R   Hearing loss    Hyperlipemia    Meniere's disease    SCC (squamous cell carcinoma) 12/2017    Past Surgical History:  Procedure Laterality Date   EYE SURGERY  Lasik   HIP SURGERY Bilateral 1999   labrum tear repair, R microfracture treatment   KNEE ARTHROSCOPY Left 1997   w/ menisectomy    Current Outpatient Medications  Medication Instructions   atorvastatin  (LIPITOR) 20 mg, Oral, Daily   finasteride  (PROPECIA ) 1 MG tablet Take 1 tablet by mouth once daily   Omega-3 Fatty Acids (FISH OIL) 500 MG CAPS Take by mouth.   sertraline  (ZOLOFT ) 50 mg, Oral, Daily   tamsulosin  (FLOMAX ) 0.4 mg, Oral, 2 times daily       Objective:   Physical Exam BP 108/70   Pulse 65   Temp 98.1 F (36.7 C) (Oral)   Resp 16   Ht 5' 8 (1.727 m)   Wt 157 lb 2 oz (71.3 kg)   SpO2 97%   BMI 23.89 kg/m  General:   Well developed, NAD, BMI noted. HEENT:  Normocephalic . Face symmetric, atraumatic Skin: Not pale. Not jaundice Neurologic:  alert & oriented X3.  Speech normal, gait appropriate for age and unassisted Psych--  Cognition and judgment appear intact.  Cooperative with normal attention span and concentration.  Behavior appropriate. No anxious or depressed appearing.      Assessment     Problem list (new patient 09-2023) Hyperlipidemia Anxiety  Hearing loss Mnire's disease BPH does not see urology regulalrly Alopecia  Low T at some point, pt elected observation   PLAN MCI?:  Saw neurology last year, blood work done and WNL.  MRI was ordered.  Was referred to neuropsychology. Overall he is  very happy the blood work for Alzheimer came back negative and he feels better. He thinks  the main driver of his symptoms was previous COVID infection. He remains very active, both physically and mentally. Hyperlipidemia: Controlled on atorvastatin  Hyperglycemia: A1c 5.9.  Recheck today. Preventive care: Plans to proceed with a colonoscopy, will let me know if he needs help scheduling.  Also discussed COVID-vaccine PNM 20 shots.  Check hep B serology. RTC 6 months CPX   "

## 2024-09-05 NOTE — Assessment & Plan Note (Signed)
 MCI?:  Saw neurology last year, blood work done and WNL.  MRI was ordered.  Was referred to neuropsychology. Overall he is very happy the blood work for Alzheimer came back negative and he feels better. He thinks  the main driver of his symptoms was previous COVID infection. He remains very active, both physically and mentally. Hyperlipidemia: Controlled on atorvastatin  Hyperglycemia: A1c 5.9.  Recheck today. Preventive care: Plans to proceed with a colonoscopy, will let me know if he needs help scheduling.  Also discussed COVID-vaccine PNM 20 shots.  Check hep B serology. RTC 6 months CPX

## 2024-09-05 NOTE — Patient Instructions (Signed)
 Please read your instructions carefully.   GO TO THE LAB :  Get the blood work    Go to the front desk for the checkout Please make an appointment for a physical exam by 02-2025  Vaccines I recommend PNM 20 COVID booster

## 2024-09-06 LAB — HEPATITIS B SURFACE ANTIBODY,QUALITATIVE: Hep B S Ab: REACTIVE — AB

## 2024-09-06 LAB — HEPATITIS B SURFACE ANTIGEN: Hepatitis B Surface Ag: NONREACTIVE

## 2025-03-06 ENCOUNTER — Encounter: Admitting: Internal Medicine

## 2025-03-21 ENCOUNTER — Encounter: Admitting: Internal Medicine
